# Patient Record
Sex: Female | Born: 1970 | Race: Black or African American | Hispanic: No | Marital: Single | Smoking: Never smoker
Health system: Southern US, Community
[De-identification: ages and names within clinical notes are randomized; demographics above are authoritative.]

## PROBLEM LIST (undated history)

## (undated) DIAGNOSIS — K5900 Constipation, unspecified: Secondary | ICD-10-CM

## (undated) DIAGNOSIS — I10 Essential (primary) hypertension: Secondary | ICD-10-CM

## (undated) DIAGNOSIS — F32A Depression, unspecified: Secondary | ICD-10-CM

## (undated) DIAGNOSIS — I340 Nonrheumatic mitral (valve) insufficiency: Secondary | ICD-10-CM

## (undated) DIAGNOSIS — I509 Heart failure, unspecified: Secondary | ICD-10-CM

## (undated) DIAGNOSIS — D649 Anemia, unspecified: Secondary | ICD-10-CM

## (undated) DIAGNOSIS — Z992 Dependence on renal dialysis: Secondary | ICD-10-CM

## (undated) DIAGNOSIS — Z8489 Family history of other specified conditions: Secondary | ICD-10-CM

## (undated) DIAGNOSIS — F329 Major depressive disorder, single episode, unspecified: Secondary | ICD-10-CM

## (undated) DIAGNOSIS — N2581 Secondary hyperparathyroidism of renal origin: Secondary | ICD-10-CM

## (undated) DIAGNOSIS — R011 Cardiac murmur, unspecified: Secondary | ICD-10-CM

## (undated) DIAGNOSIS — N189 Chronic kidney disease, unspecified: Secondary | ICD-10-CM

## (undated) HISTORY — DX: Nonrheumatic mitral (valve) insufficiency: I34.0

## (undated) HISTORY — DX: Essential (primary) hypertension: I10

## (undated) HISTORY — DX: Chronic kidney disease, unspecified: N18.9

## (undated) HISTORY — PX: CYSTECTOMY: SUR359

## (undated) HISTORY — DX: Heart failure, unspecified: I50.9

## (undated) HISTORY — DX: Secondary hyperparathyroidism of renal origin: N25.81

## (undated) SURGERY — Surgical Case
Anesthesia: *Unknown

---

## 2000-03-09 ENCOUNTER — Encounter: Admission: RE | Admit: 2000-03-09 | Discharge: 2000-03-09 | Payer: Self-pay | Admitting: Family Medicine

## 2000-10-29 ENCOUNTER — Encounter: Admission: RE | Admit: 2000-10-29 | Discharge: 2000-10-29 | Payer: Self-pay | Admitting: Family Medicine

## 2004-05-03 ENCOUNTER — Emergency Department (HOSPITAL_COMMUNITY): Admission: EM | Admit: 2004-05-03 | Discharge: 2004-05-03 | Payer: Self-pay | Admitting: Family Medicine

## 2006-10-10 ENCOUNTER — Emergency Department (HOSPITAL_COMMUNITY): Admission: EM | Admit: 2006-10-10 | Discharge: 2006-10-10 | Payer: Self-pay | Admitting: Emergency Medicine

## 2009-10-24 ENCOUNTER — Inpatient Hospital Stay (HOSPITAL_COMMUNITY): Admission: EM | Admit: 2009-10-24 | Discharge: 2009-10-28 | Payer: Self-pay | Admitting: Emergency Medicine

## 2009-10-24 ENCOUNTER — Ambulatory Visit: Payer: Self-pay | Admitting: Cardiology

## 2009-10-24 ENCOUNTER — Encounter (INDEPENDENT_AMBULATORY_CARE_PROVIDER_SITE_OTHER): Payer: Self-pay | Admitting: Internal Medicine

## 2009-11-04 ENCOUNTER — Ambulatory Visit: Payer: Self-pay | Admitting: Cardiology

## 2009-11-05 ENCOUNTER — Encounter: Admission: RE | Admit: 2009-11-05 | Discharge: 2009-11-05 | Payer: Self-pay | Admitting: Cardiology

## 2009-11-16 HISTORY — PX: AV FISTULA PLACEMENT, RADIOCEPHALIC: SHX1208

## 2009-12-03 ENCOUNTER — Ambulatory Visit: Payer: Self-pay | Admitting: Vascular Surgery

## 2009-12-05 ENCOUNTER — Ambulatory Visit: Payer: Self-pay | Admitting: Vascular Surgery

## 2009-12-05 ENCOUNTER — Ambulatory Visit (HOSPITAL_COMMUNITY): Admission: RE | Admit: 2009-12-05 | Discharge: 2009-12-05 | Payer: Self-pay | Admitting: Vascular Surgery

## 2010-01-14 ENCOUNTER — Ambulatory Visit: Payer: Self-pay | Admitting: Vascular Surgery

## 2010-03-20 ENCOUNTER — Other Ambulatory Visit (HOSPITAL_COMMUNITY): Payer: Self-pay | Admitting: Nephrology

## 2010-03-20 DIAGNOSIS — N186 End stage renal disease: Secondary | ICD-10-CM

## 2010-03-31 ENCOUNTER — Ambulatory Visit (HOSPITAL_COMMUNITY)
Admission: RE | Admit: 2010-03-31 | Discharge: 2010-03-31 | Disposition: A | Discharge status: Home / Self Care | Payer: BC Managed Care – PPO | Source: Ambulatory Visit | Attending: Nephrology | Admitting: Nephrology

## 2010-03-31 DIAGNOSIS — T82898A Other specified complication of vascular prosthetic devices, implants and grafts, initial encounter: Secondary | ICD-10-CM | POA: Insufficient documentation

## 2010-03-31 DIAGNOSIS — Y832 Surgical operation with anastomosis, bypass or graft as the cause of abnormal reaction of the patient, or of later complication, without mention of misadventure at the time of the procedure: Secondary | ICD-10-CM | POA: Insufficient documentation

## 2010-03-31 DIAGNOSIS — N186 End stage renal disease: Secondary | ICD-10-CM | POA: Insufficient documentation

## 2010-03-31 MED ORDER — IOHEXOL 300 MG/ML  SOLN: 100.0000 mL | Freq: Once | INTRAMUSCULAR | Status: AC | PRN

## 2010-04-30 LAB — POCT I-STAT 4, (NA,K, GLUC, HGB,HCT)
HCT: 38 % (ref 36.0–46.0)
Hemoglobin: 12.9 g/dL (ref 12.0–15.0)
Sodium: 133 mEq/L — ABNORMAL LOW (ref 135–145)

## 2010-04-30 LAB — SURGICAL PCR SCREEN
MRSA, PCR: NEGATIVE
Staphylococcus aureus: NEGATIVE

## 2010-05-01 LAB — BASIC METABOLIC PANEL
BUN: 46 mg/dL — ABNORMAL HIGH (ref 6–23)
BUN: 48 mg/dL — ABNORMAL HIGH (ref 6–23)
BUN: 50 mg/dL — ABNORMAL HIGH (ref 6–23)
BUN: 51 mg/dL — ABNORMAL HIGH (ref 6–23)
CO2: 28 mEq/L (ref 19–32)
CO2: 31 mEq/L (ref 19–32)
Calcium: 8.6 mg/dL (ref 8.4–10.5)
Calcium: 8.7 mg/dL (ref 8.4–10.5)
Chloride: 100 mEq/L (ref 96–112)
Chloride: 102 mEq/L (ref 96–112)
Chloride: 99 mEq/L (ref 96–112)
Creatinine, Ser: 3.67 mg/dL — ABNORMAL HIGH (ref 0.4–1.2)
GFR calc Af Amer: 17 mL/min — ABNORMAL LOW (ref >60)
GFR calc non Af Amer: 13 mL/min — ABNORMAL LOW (ref >60)
Glucose, Bld: 105 mg/dL — ABNORMAL HIGH (ref 70–99)
Glucose, Bld: 114 mg/dL — ABNORMAL HIGH (ref 70–99)
Glucose, Bld: 114 mg/dL — ABNORMAL HIGH (ref 70–99)
Potassium: 3.1 mEq/L — ABNORMAL LOW (ref 3.5–5.1)
Potassium: 3.5 mEq/L (ref 3.5–5.1)

## 2010-05-01 LAB — BLOOD GAS, ARTERIAL
Acid-Base Excess: 5.5 mmol/L — ABNORMAL HIGH (ref 0.0–2.0)
Drawn by: 308601
FIO2: 0.21 %
O2 Saturation: 95.3 %
Patient temperature: 98.6
TCO2: 26.4 mmol/L (ref 0–100)
pCO2 arterial: 38 mmHg (ref 35.0–45.0)

## 2010-05-01 LAB — DIFFERENTIAL
Basophils Absolute: 0.1 K/uL (ref 0.0–0.1)
Basophils Relative: 1 % (ref 0–1)
Basophils Relative: 1 % (ref 0–1)
Eosinophils Absolute: 0.1 10*3/uL (ref 0.0–0.7)
Eosinophils Absolute: 0.1 K/uL (ref 0.0–0.7)
Eosinophils Relative: 1 % (ref 0–5)
Eosinophils Relative: 1 % (ref 0–5)
Lymphocytes Relative: 12 % (ref 12–46)
Lymphocytes Relative: 20 % (ref 12–46)
Lymphs Abs: 1 K/uL (ref 0.7–4.0)
Monocytes Absolute: 0.3 K/uL (ref 0.1–1.0)
Monocytes Relative: 3 % (ref 3–12)
Neutro Abs: 4.4 10*3/uL (ref 1.7–7.7)
Neutro Abs: 6.9 K/uL (ref 1.7–7.7)
Neutrophils Relative %: 83 % — ABNORMAL HIGH (ref 43–77)

## 2010-05-01 LAB — CBC
HCT: 29.3 % — ABNORMAL LOW (ref 36.0–46.0)
Hemoglobin: 11.4 g/dL — ABNORMAL LOW (ref 12.0–15.0)
Hemoglobin: 9.3 g/dL — ABNORMAL LOW (ref 12.0–15.0)
Hemoglobin: 9.8 g/dL — ABNORMAL LOW (ref 12.0–15.0)
MCH: 22.9 pg — ABNORMAL LOW (ref 26.0–34.0)
MCH: 23.1 pg — ABNORMAL LOW (ref 26.0–34.0)
MCH: 23.2 pg — ABNORMAL LOW (ref 26.0–34.0)
MCHC: 31.8 g/dL (ref 30.0–36.0)
MCHC: 31.9 g/dL (ref 30.0–36.0)
MCV: 72.3 fL — ABNORMAL LOW (ref 78.0–100.0)
MCV: 72.5 fL — ABNORMAL LOW (ref 78.0–100.0)
MCV: 72.8 fL — ABNORMAL LOW (ref 78.0–100.0)
Platelets: 272 10*3/uL (ref 150–400)
Platelets: 321 10*3/uL (ref 150–400)
RBC: 4.23 MIL/uL (ref 3.87–5.11)
RBC: 4.99 MIL/uL (ref 3.87–5.11)
RDW: 22.5 % — ABNORMAL HIGH (ref 11.5–15.5)
WBC: 6.2 10*3/uL (ref 4.0–10.5)
WBC: 8.4 10*3/uL (ref 4.0–10.5)

## 2010-05-01 LAB — BASIC METABOLIC PANEL WITH GFR
BUN: 46 mg/dL — ABNORMAL HIGH (ref 6–23)
CO2: 26 meq/L (ref 19–32)
Calcium: 9 mg/dL (ref 8.4–10.5)
Chloride: 101 meq/L (ref 96–112)
Creatinine, Ser: 4 mg/dL — ABNORMAL HIGH (ref 0.4–1.2)
GFR calc non Af Amer: 12 mL/min — ABNORMAL LOW
Glucose, Bld: 201 mg/dL — ABNORMAL HIGH (ref 70–99)
Potassium: 3.3 meq/L — ABNORMAL LOW (ref 3.5–5.1)
Sodium: 139 meq/L (ref 135–145)

## 2010-05-01 LAB — VITAMIN B12: Vitamin B-12: >2000 pg/mL — ABNORMAL HIGH (ref 211–911)

## 2010-05-01 LAB — CK TOTAL AND CKMB (NOT AT ARMC)
CK, MB: 4.5 ng/mL — ABNORMAL HIGH (ref 0.3–4.0)
Relative Index: 3.6 — ABNORMAL HIGH (ref 0.0–2.5)
Total CK: 124 U/L (ref 7–177)

## 2010-05-01 LAB — PROTEIN ELECTROPHORESIS, SERUM
Albumin ELP: 51.6 % — ABNORMAL LOW (ref 55.8–66.1)
Total Protein ELP: 5.3 g/dL — ABNORMAL LOW (ref 6.0–8.3)

## 2010-05-01 LAB — LIPID PANEL
Cholesterol: 163 mg/dL (ref 0–200)
HDL: 58 mg/dL (ref >39)
LDL Cholesterol: 84 mg/dL (ref 0–99)
Total CHOL/HDL Ratio: 2.8 RATIO
Triglycerides: 105 mg/dL (ref <150)

## 2010-05-01 LAB — URINALYSIS, ROUTINE W REFLEX MICROSCOPIC
Ketones, ur: NEGATIVE mg/dL
Leukocytes, UA: NEGATIVE
Nitrite: NEGATIVE
Protein, ur: >300 mg/dL — AB
Urobilinogen, UA: 0.2 mg/dL (ref 0.0–1.0)

## 2010-05-01 LAB — BRAIN NATRIURETIC PEPTIDE: Pro B Natriuretic peptide (BNP): >3200 pg/mL — ABNORMAL HIGH (ref 0.0–100.0)

## 2010-05-01 LAB — URINE CULTURE
Colony Count: 10000
Culture  Setup Time: 201109081304

## 2010-05-01 LAB — POCT I-STAT, CHEM 8
BUN: 47 mg/dL — ABNORMAL HIGH (ref 6–23)
Calcium, Ion: 1.06 mmol/L — ABNORMAL LOW (ref 1.12–1.32)
Chloride: 103 meq/L (ref 96–112)
Creatinine, Ser: 3.7 mg/dL — ABNORMAL HIGH (ref 0.4–1.2)
Glucose, Bld: 202 mg/dL — ABNORMAL HIGH (ref 70–99)
HCT: 40 % (ref 36.0–46.0)
Hemoglobin: 13.6 g/dL (ref 12.0–15.0)
Potassium: 3.3 meq/L — ABNORMAL LOW (ref 3.5–5.1)
Sodium: 137 meq/L (ref 135–145)
TCO2: 26 mmol/L (ref 0–100)

## 2010-05-01 LAB — T4, FREE: Free T4: 1.26 ng/dL (ref 0.80–1.80)

## 2010-05-01 LAB — ANA: Anti Nuclear Antibody(ANA): NEGATIVE

## 2010-05-01 LAB — RETICULOCYTES
RBC.: 4.55 MIL/uL (ref 3.87–5.11)
Retic Ct Pct: 3 % (ref 0.4–3.1)

## 2010-05-01 LAB — RAPID URINE DRUG SCREEN, HOSP PERFORMED
Amphetamines: NOT DETECTED
Barbiturates: NOT DETECTED
Benzodiazepines: NOT DETECTED

## 2010-05-01 LAB — PROTEIN, URINE, 24 HOUR
Collection Interval-UPROT: 24 hours
Protein, 24H Urine: 922 mg/d — ABNORMAL HIGH (ref 50–100)
Urine Total Volume-UPROT: 2425 mL

## 2010-05-01 LAB — CREATININE, URINE, 24 HOUR
Collection Interval-UCRE24: 24 hours
Urine Total Volume-UCRE24: 2425 mL

## 2010-05-01 LAB — MAGNESIUM: Magnesium: 2.1 mg/dL (ref 1.5–2.5)

## 2010-05-01 LAB — CARDIAC PANEL(CRET KIN+CKTOT+MB+TROPI)
Total CK: 99 U/L (ref 7–177)
Troponin I: 0.14 ng/mL — ABNORMAL HIGH (ref 0.00–0.06)

## 2010-05-01 LAB — URINE MICROSCOPIC-ADD ON

## 2010-05-01 LAB — D-DIMER, QUANTITATIVE

## 2010-05-01 LAB — TSH: TSH: 0.752 u[IU]/mL (ref 0.350–4.500)

## 2010-05-01 LAB — FERRITIN: Ferritin: 30 ng/mL (ref 10–291)

## 2010-05-01 LAB — PHOSPHORUS: Phosphorus: 5.6 mg/dL — ABNORMAL HIGH (ref 2.3–4.6)

## 2010-05-01 LAB — MRSA PCR SCREENING: MRSA by PCR: NEGATIVE

## 2010-05-01 LAB — HEMOGLOBIN A1C: Hgb A1c MFr Bld: 6.1 % — ABNORMAL HIGH (ref <5.7)

## 2010-05-01 LAB — TROPONIN I: Troponin I: 0.15 ng/mL — ABNORMAL HIGH (ref 0.00–0.06)

## 2010-05-01 LAB — IRON AND TIBC: UIBC: 285 ug/dL

## 2010-05-07 ENCOUNTER — Ambulatory Visit (INDEPENDENT_AMBULATORY_CARE_PROVIDER_SITE_OTHER): Payer: BC Managed Care – PPO | Admitting: Vascular Surgery

## 2010-05-07 DIAGNOSIS — I770 Arteriovenous fistula, acquired: Secondary | ICD-10-CM

## 2010-05-08 NOTE — Assessment & Plan Note (Signed)
OFFICE VISIT  Nicole Parrish, Nicole Parrish DOB:  04/09/1970                                       05/07/2010 [id number]  I saw this patient in the office today to evaluate her poorly functioning left forearm AV fistula.  This is a pleasant 40 year old woman with chronic kidney disease secondary to hypertension who had a left radiocephalic fistula placed in October of 2011.  This has not been functioning adequately and she was sent to have this evaluated.  Of note, she has had no recent uremic symptoms.  Specifically she denies nausea, vomiting, anorexia, fatigue, or palpitations.  She does have a functioning catheter and they have been using 1 needle in her fistula and using the catheter also.  REVIEW OF SYSTEMS:  CARDIOVASCULAR:  She had no chest pain, chest pressure, palpitations or arrhythmias.  PHYSICAL EXAMINATION:  this is a pleasant 40 year old woman who appears his stated age.  Blood pressure 137/99, heart rate is 75, respiratory rate is 20.  Lungs:  Clear bilaterally to auscultation.  Her fistula has a good thrill in the proximal aspect of the left forearm.  It is difficult to follow  beyond the mid forearm.  The fistula does not feel especially pulsatile to suggest an outflow stenosis.  She does have a good radial pulse do there does not appear to be a significant inflow problem.  I have reviewed her previous vein mapping prior to placement of the fistula which showed that she had a reasonable upper arm and forearm cephalic vein on the left and also reasonable basilic vein on the left.  I have recommended we proceed with fistulogram to look for competing branches or areas of intimal hyperplasia and also evaluate  the inflow. This has been scheduled for a nondialysis day on 05/12/2010.  I will make further recommendations pending these results.    Judeth Cornfield. Scot Dock, M.D. Electronically Signed  CSD/MEDQ  D:  05/07/2010  T:  05/08/2010  Job:   [id number]  cc:   Dr. Jimmy Footman

## 2010-05-12 ENCOUNTER — Ambulatory Visit (HOSPITAL_COMMUNITY)
Admission: RE | Admit: 2010-05-12 | Discharge: 2010-05-12 | Disposition: A | Discharge status: Home / Self Care | Payer: BC Managed Care – PPO | Source: Ambulatory Visit | Attending: Vascular Surgery | Admitting: Vascular Surgery

## 2010-05-12 DIAGNOSIS — Y832 Surgical operation with anastomosis, bypass or graft as the cause of abnormal reaction of the patient, or of later complication, without mention of misadventure at the time of the procedure: Secondary | ICD-10-CM | POA: Insufficient documentation

## 2010-05-12 DIAGNOSIS — T82898A Other specified complication of vascular prosthetic devices, implants and grafts, initial encounter: Secondary | ICD-10-CM

## 2010-05-12 DIAGNOSIS — N189 Chronic kidney disease, unspecified: Secondary | ICD-10-CM | POA: Insufficient documentation

## 2010-05-12 DIAGNOSIS — I120 Hypertensive chronic kidney disease with stage 5 chronic kidney disease or end stage renal disease: Secondary | ICD-10-CM

## 2010-05-12 DIAGNOSIS — Z01810 Encounter for preprocedural cardiovascular examination: Secondary | ICD-10-CM | POA: Insufficient documentation

## 2010-05-12 DIAGNOSIS — N186 End stage renal disease: Secondary | ICD-10-CM

## 2010-05-12 LAB — POCT I-STAT, CHEM 8
BUN: 37 mg/dL — ABNORMAL HIGH (ref 6–23)
Creatinine, Ser: 8.2 mg/dL — ABNORMAL HIGH (ref 0.4–1.2)
Glucose, Bld: 93 mg/dL (ref 70–99)
Hemoglobin: 12.2 g/dL (ref 12.0–15.0)
Potassium: 5.1 mEq/L (ref 3.5–5.1)
Sodium: 133 mEq/L — ABNORMAL LOW (ref 135–145)
TCO2: 49 mmol/L (ref 0–100)

## 2010-05-20 NOTE — Op Note (Signed)
  [name], [name]               ACCOUNT NO.:  [id number]  MEDICAL RECORD NO.:  [id number]           PATIENT TYPE:  O  LOCATION:  SDSC                         FACILITY:  [facility]  PHYSICIAN:  Judeth Cornfield. Scot Dock, M.D.DATE OF BIRTH:  Apr 30, 1970  DATE OF PROCEDURE:  05/12/2010 DATE OF DISCHARGE:  05/12/2010                              OPERATIVE REPORT   INDICATIONS:  This is a 40 year old woman who had a forearm fistula placed in October 2011.  This has been slow to mature and she is currently being dialyzed via a Diatek catheter.  She comes in for an elective fistulogram to evaluate her fistula.  PROCEDURE: 1. Ultrasound-guided access to left forearm AV fistula. 2. Fistulogram of left forearm AV fistula.  SURGEON:  Judeth Cornfield. Scot Dock, MD  ANESTHESIA:  Local.  TECHNIQUE:  The patient was taken to the PV lab and the left forearm was prepped and draped in the usual sterile fashion.  Under ultrasound guidance and after the skin was anesthetized, the proximal AV fistula was cannulated with a micropuncture needle.  Guidewire was introduced and the micropuncture sheath introduced.  Fistulogram was obtained using hand injections and the fistula was evaluated from the proximal anastomosis to the central veins.  The proximal fistula has an area of 2- 2.5 cm of narrowing likely related to intimal hyperplasia.  The radial artery is patent, although somewhat small.  There is one competing branch approximately 4 cm from the proximal anastomosis.  Beyond this, the vein is widely patent with no significant competing branches.  At the antecubital level, the cephalic vein continues up the upper arm and is widely patent with no significant competing branches.  The antecubital vein at the antecubital level feeds into the basilic vein which was also patent.  The subclavian and brachiocephalic veins are widely patent on the left.  CONCLUSIONS: 1. 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 2. One competing branch approximately 4 cm in the arterial     anastomosis.     Judeth Cornfield. Scot Dock, M.D.    CSD/MEDQ  D:  05/12/2010  T:  05/12/2010  Job:  [id number]  cc:   Jeneen Rinks L. Deterding, M.D.  Electronically Signed by Deitra Mayo M.D. on 05/20/2010 07:42:23 AM

## 2010-05-21 ENCOUNTER — Ambulatory Visit (HOSPITAL_COMMUNITY)
Admission: RE | Admit: 2010-05-21 | Discharge: 2010-05-21 | Disposition: A | Discharge status: Home / Self Care | Payer: BC Managed Care – PPO | Source: Ambulatory Visit | Attending: Vascular Surgery | Admitting: Vascular Surgery

## 2010-06-02 ENCOUNTER — Ambulatory Visit (HOSPITAL_COMMUNITY)
Admission: RE | Admit: 2010-06-02 | Discharge: 2010-06-02 | Disposition: A | Discharge status: Home / Self Care | Payer: BC Managed Care – PPO | Source: Ambulatory Visit | Attending: Vascular Surgery | Admitting: Vascular Surgery

## 2010-06-02 DIAGNOSIS — N186 End stage renal disease: Secondary | ICD-10-CM

## 2010-06-02 DIAGNOSIS — I120 Hypertensive chronic kidney disease with stage 5 chronic kidney disease or end stage renal disease: Secondary | ICD-10-CM | POA: Insufficient documentation

## 2010-06-02 DIAGNOSIS — Z79899 Other long term (current) drug therapy: Secondary | ICD-10-CM | POA: Insufficient documentation

## 2010-06-02 DIAGNOSIS — Y832 Surgical operation with anastomosis, bypass or graft as the cause of abnormal reaction of the patient, or of later complication, without mention of misadventure at the time of the procedure: Secondary | ICD-10-CM | POA: Insufficient documentation

## 2010-06-02 DIAGNOSIS — Z992 Dependence on renal dialysis: Secondary | ICD-10-CM | POA: Insufficient documentation

## 2010-06-02 DIAGNOSIS — Z7982 Long term (current) use of aspirin: Secondary | ICD-10-CM | POA: Insufficient documentation

## 2010-06-02 DIAGNOSIS — T82898A Other specified complication of vascular prosthetic devices, implants and grafts, initial encounter: Secondary | ICD-10-CM

## 2010-06-02 DIAGNOSIS — T82598A Other mechanical complication of other cardiac and vascular devices and implants, initial encounter: Secondary | ICD-10-CM | POA: Insufficient documentation

## 2010-06-02 LAB — SURGICAL PCR SCREEN
MRSA, PCR: NEGATIVE
Staphylococcus aureus: NEGATIVE

## 2010-06-03 HISTORY — PX: AV FISTULA REPAIR: SHX563

## 2010-06-03 LAB — POCT I-STAT 4, (NA,K, GLUC, HGB,HCT)
Glucose, Bld: 96 mg/dL (ref 70–99)
HCT: 31 % — ABNORMAL LOW (ref 36.0–46.0)
Potassium: 4.6 mEq/L (ref 3.5–5.1)

## 2010-06-03 NOTE — Op Note (Signed)
NAMEMONTERIA, HASSELMAN NO.:  [id number]  MEDICAL RECORD NO.:  [id number]           PATIENT TYPE:  LOCATION:                                 FACILITY:  PHYSICIAN:  Conrad [place], MD       DATE OF BIRTH:  March 21, 1970  DATE OF PROCEDURE: DATE OF DISCHARGE:                              OPERATIVE REPORT   PROCEDURE:  Ligation of competing branches of a left radiocephalic arteriovenous fistula.  PREOPERATIVE DIAGNOSIS:  Nonmaturing left radiocephalic arteriovenous fistula.  POSTOPERATIVE DIAGNOSIS:  Nonmaturing left radiocephalic arteriovenous fistula.  SURGEON:  Conrad Avery, MD.  ANESTHESIA:  General.  FINDINGS:  In this case included 1. Three side branches at the level of the antecubital off the     cephalic vein. 2. Improved thrill at the end of case.  SPECIMENS:  None.  INDICATIONS:  This is a 40 year old female that previously undergone a left radiocephalic arteriovenous fistula.  It was failing to mature and attempts to cannulate had failed.  A fistulogram was completed, which demonstrated significant antecubital side branches siphoning flow off of it, it is unclear if there is also a proximal cephalic vein stenosis versus an underfilling phenomenon due to the siphoned flow.  Prior to proceeding with possible cephalic turndown procedure versus interposition graft of the proximal cephalic vein, I felt that first attempt at improving flow through this cephalic vein via a ligation of competing branches would be the initial step and then reinterrogation of this fistula after that.  The patient is aware of the risks of this procedure include bleeding, infection, possible thrombosis of this fistula, and possible need for additional procedures.  The patient was aware of these risks and agreed to proceed forward with such.  DESCRIPTION OF THE OPERATION:  After full informed written consent was obtained from the patient, she was brought back to  the operating room, placed supine upon on the operating table.  Prior to induction, she received IV antibiotics.  She was then prepped and draped in standard fashion for left arm access procedure.  I turned my attention to her left antecubitum.  I previously identified the cephalic vein with large branches at the antecubitum under nonsterile SonoSite interrogation and marked out on the arm previously where this vein was located.  After the arm had been prepped those marks remained in place, I injected a total about 6 mL of 0.5% Marcaine without epinephrine at this location, I made a incision longitudinally over the cephalic vein and dissected down to the cephalic vein. Immediately it became evident there was one large branch extending toward the basilic system and exactly as depicted on the venogram.  I ligated this with 2 ties of 2-0 silk and then transected this side branch and I then dissected out further the cephalic vein and identified 2 additional side branches. These were ligated also with silk ties and then transected.  At thispoint, after interrogating rest of the cephalic vein at the antecubital level, there was no more side branches present.  I interrogated the fistula then with continuous Doppler, there remained a good arteriovenous fistula signal that  demonstrated no significant arterial component of it.  I also interrogated the rest, there remained a dopplerable radial signal.  At this point, the arm was irrigated out and reapproximated the subcu tissue with interrupted stitch of 3-0 Vicryl. The skin was then reapproximated with running subcuticular 4-0 Monocryl. Skin was then cleaned, dried and then Dermabond was used to reinforce the skin closure.  But at the end of the case, the thrill in the cephalic vein was improved from previous, it was actually palpable at this point at the level of the antecubital and slightly onto the upper arm where I suspect the cephalic vein runs  deep.  The patient was allowed to awaken without any difficulties.  The plan is to discharge her home.  COMPLICATIONS:  None.  CONDITION:  Stable.     Conrad Myrtlewood, MD     BLC/MEDQ  D:  06/02/2010  T:  06/02/2010  Job:  [id number]  Electronically Signed by Adele Barthel MD on 06/03/2010 10:27:05 AM

## 2010-06-20 ENCOUNTER — Ambulatory Visit (INDEPENDENT_AMBULATORY_CARE_PROVIDER_SITE_OTHER): Payer: BC Managed Care – PPO | Admitting: Vascular Surgery

## 2010-06-20 ENCOUNTER — Telehealth: Payer: Self-pay | Admitting: Cardiology

## 2010-06-20 DIAGNOSIS — N186 End stage renal disease: Secondary | ICD-10-CM

## 2010-06-20 NOTE — Telephone Encounter (Signed)
PT THINKS SHE MAY HAVE TO START TAKING A MED FOR RAPID HR. CHART PLACED IN BOX.

## 2010-06-20 NOTE — Telephone Encounter (Signed)
Returning pts call.  L/M on the home # and cell#.

## 2010-06-23 ENCOUNTER — Telehealth: Payer: Self-pay | Admitting: *Deleted

## 2010-06-23 NOTE — Assessment & Plan Note (Signed)
OFFICE VISIT  NESTORA, INNAMORATO DOB:  [birth year]                                       06/20/2010 [id number]  This is postop followup.  HISTORY OF PRESENT ILLNESS:  This is a 40 year old patient who underwent a ligation of side branches off of her left radiocephalic arteriovenous fistula.  At this point she notes some intermittent numbness in her hand but is able to complete her activities of daily living.  No steal symptomatology is noted by this patient.  PHYSICAL EXAMINATION:  Blood pressure today was 133/91, heart rate 79, respirations 12.  On focused exam her left arm the incisions at the antecubitum are well-healed.  There is a strongly palpable thrill in this radiocephalic arteriovenous fistula at this point.  I interrogated the entirety of the cephalic vein with a SonoSite and it appears to be greater than 6 mm throughout this entire vein.  Also in the forearm appears to be less than 6 mm in depth.  MEDICAL DECISION MAKING:  This is a 40 year old female who now with additional side branch ligation at the antecubitum appears to have had successfully matured left radiocephalic arteriovenous fistula.  At this point I would go ahead and give permission to proceed forward with cannulation.  After two successful cannulations I would at that point remove her tunneled dialysis catheter.    Conrad Cordova, MD Electronically Signed  BLC/MEDQ  D:  06/20/2010  T:  06/23/2010  Job:  [id number]

## 2010-07-01 NOTE — Assessment & Plan Note (Signed)
OFFICE VISIT   LEALAH, URBANIAK  DOB:  1970/03/13                                       01/14/2010  [id number]   The patient returns today for initial followup regarding her left Cimino  fistula which I created on 12/05/2009 for end-stage renal disease.  She  is currently using a Diatek dialysis catheter in the right internal  jugular vein.  She has no pain or numbness in the left hand.  She does  have a pulse and palpable thrill overlying the fistula at the  antecubital area.  The vein is slightly deep but easily palpable and may  well be satisfactory at its current location.  The fistula will be ready  to try in late January and hopefully it will provide a satisfactory site  for vascular access for this nice lady.     Nelda Severe Kellie Simmering, M.D.  Electronically Signed   JDL/MEDQ  D:  01/14/2010  T:  01/14/2010  Job:  [id number]   cc:   Louis Meckel, M.D.

## 2010-07-01 NOTE — Consult Note (Signed)
NEW PATIENT CONSULTATION   Nicole Parrish, Nicole Parrish  DOB:  [birth year]                                       12/03/2009  [id number]   The patient is a 40 year old African American female with recent  diagnosis of end-stage renal disease not on hemodialysis.  She is right-  handed.  She was referred for vascular access.  She had a recent  hospitalization when her diagnosis was made 1 month ago and at that time  she had congestive heart failure which has resolved.   CHRONIC MEDICAL PROBLEMS:  1. Hypertension.  2. Congestive heart failure.  3. End-stage renal disease.  4. Negative for coronary artery disease, diabetes, hyperlipidemia,      COPD, or stroke.   SOCIAL HISTORY:  She is single; has no children.  She works as a Education officer, environmental.  Does not use tobacco or alcohol.   FAMILY HISTORY:  Positive for an uncle who is on hemodialysis.  Diabetes  is positive in the family as well as hypertension.  Father had lung  cancer.   REVIEW OF SYSTEMS:  Positive for weight loss, dizziness, orthopnea which  has now resolved, anemia, melanotic stools, constipation, and chronic  kidney disease.  All other systems are negative per review of systems.   PHYSICAL EXAM:  Blood pressure 140/80, heart rate 70, respirations 14.  GENERAL:  She is a well-developed, well-nourished female who is in no  apparent distress.  She is alert and oriented x3.  HEENT:  Exam is normal for age.  EOMs intact.  LUNGS:  Clear to auscultation.  No rhonchi or wheezing.  CARDIOVASCULAR:  Regular rhythm.  No murmurs.  Carotid pulses 3+, no  bruits.  ABDOMEN:  Soft, nontender with no masses.  MUSCULOSKELETAL:  Free of major deformities.  NEUROLOGIC:  Normal.  SKIN:  Free of rashes.  LOWER EXTREMITY EXAM:  No evidence of ischemia or significant edema.   Today, I ordered bilateral upper extremity vein mapping.  Chart reviewed  and interpreted.  Her cephalic veins are patent and compressible  bilaterally, and it appears the left forearm cephalic is probably  adequate and, if not, the upper arm cephalic should be adequate.  She  also has adequate basilic veins bilaterally.   We will plan to create a left forearm or upper arm AV fistula 2 days  from now on October 20th at [facility] as an outpatient.  Hopefully,  this will provide a satisfactory site for vascular access for this nice  lady.     Nelda Severe Kellie Simmering, M.D.  Electronically Signed   JDL/MEDQ  D:  12/03/2009  T:  12/04/2009  Job:  [id number]   cc:   Louis Meckel, M.D.

## 2010-07-01 NOTE — Procedures (Signed)
CEPHALIC VEIN MAPPING   INDICATION:  AV fistula placement.   HISTORY:  Chronic kidney disease.   EXAM:  The right cephalic vein is compressible.   Diameter measurements range from 0.26 to 0.75 cm.   The basilic vein is compressible.   Diameter measurements range from 0.50 to 0.57 cm.   The left cephalic vein is compressible.   Diameter measurements range from 0.38 to 0.88 cm.   The left basilic vein is compressible.   Diameter measurements range from 0.48 to 0.52 cm.   See attached worksheet for all measurements along with branches marked.   IMPRESSION:  Patent bilateral cephalic vein which is of acceptable  diameter to use for a dialysis access site.   ___________________________________________  Nelda Severe. Kellie Simmering, M.D.   OD/MEDQ  D:  12/03/2009  T:  12/03/2009  Job:  [id number]

## 2010-07-02 NOTE — Telephone Encounter (Signed)
Pt never called back.

## 2010-08-06 ENCOUNTER — Telehealth: Payer: Self-pay | Admitting: Cardiology

## 2010-08-06 NOTE — Telephone Encounter (Signed)
RN set pt up to f/u with Dr. Johnsie Cancel on 09/03/10 at 1030.  Pt notified of appointment.

## 2010-08-06 NOTE — Telephone Encounter (Signed)
Called in today wondering about who the next cardiologist that Dr. Doreatha Lew referred her to is. Please call back. I have pulled her chart.

## 2010-08-08 ENCOUNTER — Other Ambulatory Visit (HOSPITAL_COMMUNITY): Payer: Self-pay | Admitting: Nephrology

## 2010-08-08 DIAGNOSIS — N186 End stage renal disease: Secondary | ICD-10-CM

## 2010-08-13 ENCOUNTER — Ambulatory Visit (HOSPITAL_COMMUNITY)
Admission: RE | Admit: 2010-08-13 | Discharge: 2010-08-13 | Disposition: A | Discharge status: Home / Self Care | Payer: BC Managed Care – PPO | Source: Ambulatory Visit | Attending: Nephrology | Admitting: Nephrology

## 2010-08-13 ENCOUNTER — Other Ambulatory Visit (HOSPITAL_COMMUNITY): Payer: Self-pay | Admitting: Nephrology

## 2010-08-13 DIAGNOSIS — N186 End stage renal disease: Secondary | ICD-10-CM

## 2010-08-13 DIAGNOSIS — T82898A Other specified complication of vascular prosthetic devices, implants and grafts, initial encounter: Secondary | ICD-10-CM | POA: Insufficient documentation

## 2010-08-13 DIAGNOSIS — Y832 Surgical operation with anastomosis, bypass or graft as the cause of abnormal reaction of the patient, or of later complication, without mention of misadventure at the time of the procedure: Secondary | ICD-10-CM | POA: Insufficient documentation

## 2010-08-13 MED ORDER — IOHEXOL 300 MG/ML  SOLN
100.0000 mL | Freq: Once | INTRAMUSCULAR | Status: AC | PRN
  Administered 2010-08-13: 32 mL via INTRAVENOUS

## 2010-08-22 ENCOUNTER — Encounter: Payer: Self-pay | Admitting: Vascular Surgery

## 2010-08-27 NOTE — Progress Notes (Signed)
[facility] [place]  Established Dialysis Access  History of Present Illness  Nicole Parrish is a 40 y.o. female who presents for re-evaluation for permanent access.  The patient is right hand dominant.  Previous access procedures have been completed in the right arm.  The patient's complication from previous access procedures include: poorly functioning L RC AVF.  The patient has had multiple episodes of infilitration of the L RC AVF.  Most recent procedure was Ligation of competing branches of a left radiocephalic AVF (Date: 123XX123). And recently had a fistulogram done on 08/13/10 which demonstrated that the fistula was widely patent.  Past Medical History, Past Surgical History, Social History, Family History, Medications, Allergies, and Review of Systems are unchanged from previous visit.  Physical Examination  Filed Vitals:   08/29/10 0917  BP: 100/67  Pulse: 80  Resp: 18    General: A&O x 3, WDWN  Pulmonary: Sym exp, good air movt, CTAB, no rales, rhonchi, & wheezing  Cardiac: RRR, Nl S1, S2, no Murmurs, rubs or gallops  Gastrointestinal: soft, NTND, -G/R, - HSM, - masses, - CVAT B  Musculoskeletal: M/S 5/5 throughout, Extremities without  ischemic changes, palpable thrill in right forearm, incisions well healed  Neurologic: CN 2-12 intact, Pain and light touch intact in extremities, Motor exam as listed above  Medical Decision Making  Nicole Parrish is a 40 y.o. female who presents with ESRD requiring hemodialysis with a L RC AVF that is difficult to access.   In this patient, I would duplex the right right RC AVF to evaluate depth and diameter of the conduit to see if a superficialization procedure is necessary.  This study will be obtained in the next 2 weeks and follow up in the office after such.  Adele Barthel, MD [facility] [place] Office: [phone] Pager: [phone]

## 2010-08-29 ENCOUNTER — Encounter: Payer: Self-pay | Admitting: Vascular Surgery

## 2010-08-29 ENCOUNTER — Ambulatory Visit (INDEPENDENT_AMBULATORY_CARE_PROVIDER_SITE_OTHER): Payer: BC Managed Care – PPO | Admitting: Vascular Surgery

## 2010-08-29 VITALS — BP 100/67 | HR 80 | Resp 18 | Ht 60.0 in | Wt 175.0 lb

## 2010-08-29 DIAGNOSIS — Z992 Dependence on renal dialysis: Secondary | ICD-10-CM | POA: Insufficient documentation

## 2010-08-29 DIAGNOSIS — N186 End stage renal disease: Secondary | ICD-10-CM | POA: Insufficient documentation

## 2010-08-29 NOTE — Progress Notes (Signed)
ESRD  CK AVF  ? POORLY FUNCTIONING  LIGATION ON 06/02/10

## 2010-09-03 ENCOUNTER — Ambulatory Visit (INDEPENDENT_AMBULATORY_CARE_PROVIDER_SITE_OTHER): Payer: BC Managed Care – PPO | Admitting: Cardiovascular Disease

## 2010-09-03 ENCOUNTER — Encounter: Payer: Self-pay | Admitting: Cardiovascular Disease

## 2010-09-03 VITALS — BP 114/73 | HR 86 | Ht 60.0 in | Wt 169.0 lb

## 2010-09-03 DIAGNOSIS — I059 Rheumatic mitral valve disease, unspecified: Secondary | ICD-10-CM

## 2010-09-03 DIAGNOSIS — I2789 Other specified pulmonary heart diseases: Secondary | ICD-10-CM

## 2010-09-03 DIAGNOSIS — I340 Nonrheumatic mitral (valve) insufficiency: Secondary | ICD-10-CM

## 2010-09-03 DIAGNOSIS — D649 Anemia, unspecified: Secondary | ICD-10-CM | POA: Insufficient documentation

## 2010-09-03 DIAGNOSIS — N186 End stage renal disease: Secondary | ICD-10-CM

## 2010-09-03 DIAGNOSIS — N189 Chronic kidney disease, unspecified: Secondary | ICD-10-CM | POA: Insufficient documentation

## 2010-09-03 DIAGNOSIS — I272 Pulmonary hypertension, unspecified: Secondary | ICD-10-CM | POA: Insufficient documentation

## 2010-09-03 NOTE — Progress Notes (Signed)
40 yo patient of Dr Doreatha Lew and [place].  Added on to DOD schedule for tachycardia, history of MR and pulmonary hypertension.  Last seen by Dr Martinique 10/25/09.  Had moderate MR with pulmonary htn estimated PA pressure 72 mmHG by TR.  Hydralazine dose adjusted.  Since then has been put on dialysis.  Having difficulty with fistula and still using ditek catheter.  Has episodes of palpitations.  Imprvoved with Torprol.  Dose just increased a month ago.  Indicates significant anemia requiring transfusions  Denies SSCP, mild chronic exertional dyspnea.  Some paresthesias in left arm from fistula.    Over 50% time spent reviewing old records from [place] cardiology:  Time 30 minutes.  Remainder spent face to face with patient Echo 10/24/09 Moderate MR EF 50-55% PA 72 mmHg Reviewed labs 06/02/10  Hb 10.5  ROS: Denies fever, malais, weight loss, blurry vision, decreased visual acuity, cough, sputum, SOB, hemoptysis, pleuritic pain, palpitaitons, heartburn, abdominal pain, melena, lower extremity edema, claudication, or rash.  All other systems reviewed and negative   General: Affect appropriate Healthy:  appears stated age 40: normal Neck supple with no adenopathy JVP normal no bruits no thyromegaly Lungs clear with no wheezing and good diaphragmatic motion Heart:  S1/S2 SEM murmur,rub, gallop or click PMI normal Abdomen: benighn, BS positve, no tenderness, no AAA no bruit.  No HSM or HJR Distal pulses intact with no bruits No edema Neuro non-focal Skin warm and dry No muscular weakness Fistula left arm good thrill ditek catheter right subclavian  Medications Current Outpatient Prescriptions  Medication Sig Dispense Refill  . aspirin 81 MG tablet Take 81 mg by mouth daily.        Marland Kitchen b complex-vitamin c-folic acid (NEPHRO-VITE) 0.8 MG TABS Take 0.8 mg by mouth at bedtime.        . calcium acetate (PHOSLO) 667 MG capsule Take 667 mg by mouth 2 (two) times daily with a meal.        .  isosorbide mononitrate (IMDUR) 30 MG 24 hr tablet Take 30 mg by mouth daily.        Marland Kitchen lidocaine-prilocaine (EMLA) cream Apply topically as needed.        . metoprolol (TOPROL-XL) 50 MG 24 hr tablet Take 50 mg by mouth daily.          Allergies Review of patient's allergies indicates no known allergies.  Family History: No family history on file.  Social History: History   Social History  . Marital Status: Single    Spouse Name: N/A    Number of Children: N/A  . Years of Education: N/A   Occupational History  . Not on file.   Social History Main Topics  . Smoking status: Never Smoker   . Smokeless tobacco: Never Used  . Alcohol Use: No  . Drug Use: Not on file  . Sexually Active: Not on file   Other Topics Concern  . Not on file   Social History Narrative  . No narrative on file    Electrocardiogram:  05/14/10  NSR 78 normal ECG  Assessment and Plan

## 2010-09-03 NOTE — Assessment & Plan Note (Signed)
Ditek still in will see if it is in RA.  Increase risk of infection  Hopefully fistula will be usable soon.  ? Start [place] F/U labs Dr Dederding

## 2010-09-03 NOTE — Patient Instructions (Signed)
Your physician recommends that you schedule a follow-up appointment in: 3 MONTHS WITH DR Martinique  Your physician has requested that you have an echocardiogram. Echocardiography is a painless test that uses sound waves to create images of your heart. It provides your doctor with information about the size and shape of your heart and how well your heart's chambers and valves are working. This procedure takes approximately one hour. There are no restrictions for this procedure. PT'S CONVENIENCE DX MR

## 2010-09-03 NOTE — Assessment & Plan Note (Signed)
F/U echo  If significant may need right and left heart cath.

## 2010-09-03 NOTE — Assessment & Plan Note (Addendum)
Not clealy defined or Rx in past.  Echo  Only on nitrates now not hydralazine.  Consider right heart cath if PA pressures over 40mmHg especially if MR worse

## 2010-09-03 NOTE — Assessment & Plan Note (Signed)
F/U renal  Last Hb 10.5  Start erythropoitin

## 2010-09-04 ENCOUNTER — Encounter: Payer: Self-pay | Admitting: *Deleted

## 2010-09-09 NOTE — Progress Notes (Signed)
[facility] [place]  Established Dialysis Access  History of Present Illness   Nicole Parrish is a 40 y.o. female who presents for re-evaluation for permanent access. The patient returns today for left radiocephalic arteriovenous fistula duplex ultrasound to evaluate the depth and velocities within this access. Since her last visit the patient has had no new complaints.  Previous attempts at cannulation of the fistula have been unsuccessful, resulting in infiltration.  Past Medical History, Past Surgical History, Social History, Family History, Medications, Allergies, and Review of Systems are unchanged from previous visit.   Physical Examination   Filed Vitals:   09/12/10 1105  BP: 106/69  Pulse: 73  Temp: 98.2 F (36.8 C)    General: A&O x 3, WDWN   Pulmonary: Sym exp, good air movt, CTAB, no rales, rhonchi, & wheezing   Cardiac: RRR, Nl S1, S2, no Murmurs, rubs or gallops   Gastrointestinal: soft, NTND, -G/R, - HSM, - masses, - CVAT B   Musculoskeletal: M/S 5/5 throughout, Extremities without ischemic changes, palpable thrill in the left forearm, incisions well healed   Neurologic: CN 2-12 intact, Pain and light touch intact in extremities, Motor exam as listed above   Non-Invasive Vascular Imaging  R RC AVF Duplex (Date: 99991111) Forearm cephalic: 6.4 mm - 7.0 mm except at arterial end 2.2 - 3.3 mm, depth 4.8 mm to 11 mm  Medical Decision Making   Nicole Parrish is a 40 y.o. female who presents with ESRD requiring hemodialysis with a L RC AVF that is difficult to access.   Based on this patient's study and clinical examination I have offered the patient the following: superficalization of the L RC AVF   The patient is aware of the risks and benefits of the procedure include but are not limited to: bleeding, infection, and possible thrombosis of the L RC AVF.  The patient is scheduled for 6 AUG 12.  Adele Barthel, MD  [facility] [place]  Office: [phone]  Pager: [phone]

## 2010-09-10 ENCOUNTER — Telehealth: Payer: Self-pay | Admitting: Cardiovascular Disease

## 2010-09-10 ENCOUNTER — Ambulatory Visit (HOSPITAL_COMMUNITY): Payer: BC Managed Care – PPO | Attending: Cardiology | Admitting: Radiology

## 2010-09-10 DIAGNOSIS — I059 Rheumatic mitral valve disease, unspecified: Secondary | ICD-10-CM

## 2010-09-10 DIAGNOSIS — I270 Primary pulmonary hypertension: Secondary | ICD-10-CM | POA: Insufficient documentation

## 2010-09-10 DIAGNOSIS — I340 Nonrheumatic mitral (valve) insufficiency: Secondary | ICD-10-CM

## 2010-09-10 DIAGNOSIS — I379 Nonrheumatic pulmonary valve disorder, unspecified: Secondary | ICD-10-CM | POA: Insufficient documentation

## 2010-09-10 NOTE — Telephone Encounter (Signed)
Left message for pt, results not available yet. Will call as soon as available Nicole Parrish

## 2010-09-10 NOTE — Telephone Encounter (Signed)
Pt had echo this am, wants to know if results are ready

## 2010-09-12 ENCOUNTER — Encounter (INDEPENDENT_AMBULATORY_CARE_PROVIDER_SITE_OTHER): Payer: BC Managed Care – PPO

## 2010-09-12 ENCOUNTER — Telehealth: Payer: Self-pay | Admitting: Cardiovascular Disease

## 2010-09-12 ENCOUNTER — Encounter: Payer: Self-pay | Admitting: Vascular Surgery

## 2010-09-12 ENCOUNTER — Ambulatory Visit (INDEPENDENT_AMBULATORY_CARE_PROVIDER_SITE_OTHER): Payer: BC Managed Care – PPO | Admitting: Vascular Surgery

## 2010-09-12 VITALS — BP 106/69 | HR 73 | Temp 98.2°F | Ht 60.0 in | Wt 170.0 lb

## 2010-09-12 DIAGNOSIS — N186 End stage renal disease: Secondary | ICD-10-CM | POA: Insufficient documentation

## 2010-09-12 DIAGNOSIS — T82598A Other mechanical complication of other cardiac and vascular devices and implants, initial encounter: Secondary | ICD-10-CM

## 2010-09-12 NOTE — Telephone Encounter (Signed)
Pt aware of echo results. She verbalized understanding.

## 2010-09-12 NOTE — Telephone Encounter (Signed)
Returning call back to nurse.  

## 2010-09-12 NOTE — Telephone Encounter (Signed)
Test results

## 2010-09-12 NOTE — Progress Notes (Signed)
F/u after AVF duplex of left arm today.   Has HD on Tuesday, Thursday and Saturdays via Right IJ cath.

## 2010-09-12 NOTE — Telephone Encounter (Signed)
Patient showed up requesting a copy of her Echo report  Medical records came and handled the request

## 2010-09-12 NOTE — Telephone Encounter (Signed)
lmom for patient to call back 

## 2010-09-12 NOTE — Telephone Encounter (Signed)
Mrs. Deakins came in and exchanged a copy of her Echo for a ROI.

## 2010-09-19 NOTE — Procedures (Unsigned)
VASCULAR LAB EXAM  INDICATION:  Followup nonmaturing left radiocephalic fistula.  HISTORY: CKD. Diabetes:  No. Cardiac:  No. Hypertension:  No.  EXAM:  The left radiocephalic fistula was examined with color Doppler and duplex.  The fistula is patent.  Depths and diameters were obtained and are detailed on attached diagram.  IMPRESSION:  Patent left radiocephalic fistula with depths and diameter measurements on attached diagram.  ___________________________________________ Conrad Schulenburg, MD  LT/MEDQ  D:  09/12/2010  T:  09/12/2010  Job:  [id number]

## 2010-09-22 ENCOUNTER — Ambulatory Visit (HOSPITAL_COMMUNITY)
Admission: RE | Admit: 2010-09-22 | Discharge: 2010-09-22 | Disposition: A | Discharge status: Home / Self Care | Payer: BC Managed Care – PPO | Source: Ambulatory Visit | Attending: Vascular Surgery | Admitting: Vascular Surgery

## 2010-09-22 DIAGNOSIS — Y832 Surgical operation with anastomosis, bypass or graft as the cause of abnormal reaction of the patient, or of later complication, without mention of misadventure at the time of the procedure: Secondary | ICD-10-CM | POA: Insufficient documentation

## 2010-09-22 DIAGNOSIS — T82598A Other mechanical complication of other cardiac and vascular devices and implants, initial encounter: Secondary | ICD-10-CM | POA: Insufficient documentation

## 2010-09-22 DIAGNOSIS — T82898A Other specified complication of vascular prosthetic devices, implants and grafts, initial encounter: Secondary | ICD-10-CM

## 2010-09-22 DIAGNOSIS — I428 Other cardiomyopathies: Secondary | ICD-10-CM | POA: Insufficient documentation

## 2010-09-22 DIAGNOSIS — I120 Hypertensive chronic kidney disease with stage 5 chronic kidney disease or end stage renal disease: Secondary | ICD-10-CM

## 2010-09-22 DIAGNOSIS — N186 End stage renal disease: Secondary | ICD-10-CM

## 2010-09-22 DIAGNOSIS — Z01812 Encounter for preprocedural laboratory examination: Secondary | ICD-10-CM | POA: Insufficient documentation

## 2010-09-22 DIAGNOSIS — I10 Essential (primary) hypertension: Secondary | ICD-10-CM | POA: Insufficient documentation

## 2010-09-22 LAB — POCT I-STAT 4, (NA,K, GLUC, HGB,HCT): Glucose, Bld: 96 mg/dL (ref 70–99)

## 2010-09-22 LAB — SURGICAL PCR SCREEN: MRSA, PCR: NEGATIVE

## 2010-09-22 LAB — HCG, SERUM, QUALITATIVE: Preg, Serum: NEGATIVE

## 2010-09-28 NOTE — Op Note (Signed)
Nicole Parrish, BRAUTIGAN NO.:  [id number]  MEDICAL RECORD NO.:  [id number]  LOCATION:  [facility]                         FACILITY:  [facility]  PHYSICIAN:  Nicole Foreston, MD       DATE OF BIRTH:  Mar 14, 1970  DATE OF PROCEDURE:  09/22/2010 DATE OF DISCHARGE:                              OPERATIVE REPORT   PROCEDURE:  Superficialization of left radiocephalic arteriovenous fistula.  PREOPERATIVE DIAGNOSIS:  Malfunctioning of left radiocephalic arteriovenous fistula.  POSTOPERATIVE DIAGNOSIS:  Malfunctioning of left radiocephalic arteriovenous fistula.  SURGEON:  Nicole Canalou, MD  ASSISTANT:  Nicole Furnace, PA-C  ANESTHESIA:  Monitored anesthesia care and local.  FINDINGS IN THIS CASE:  A deep forearm cephalic vein that is about 6-8 mm proximally and about 2.5 mm distally.  SPECIMENS:  None.  ESTIMATED BLOOD LOSS:  Minimal.  INDICATIONS:  This is a 40 year old patient with a left radiocephalic arteriovenous fistula that has been slow to mature.  She previously has undergone fistulogram which resulted in a ligation of competing branches.  I then allowed her to mature her fistula.  On SonoSite, it appeared to be adequately dilated but too deep for easy cannulation. They attempted cannulation previously but had not been successful.  Subsequently I recommended that she proceed with a superficialization procedure to try to make this fistula usable.  She is aware of the risks of this procedure to include bleeding, infection, and possible thrombosis of this fistula.  She is aware of these risks and agreed to proceed forward.  DESCRIPTION OF OPERATION:  After full informed written consent was obtained from the patient, the patient was brought back to the operating, and placed supine upon the operating table.  Prior to induction, she had received IV antibiotics.  She was then prepped and draped in the standard fashion for a left arm access procedure.  After  obtaining adequate sedation, I turned my attention first to her left wrist.  Under SonoSite guidance, I was able to mark out the path of the cephalic vein. I then anesthetized the pathway from the wrist all the way up to the antecubitum with a total of about 40 mL of a 1:1 mixture of 0.5% Marcaine without epinephrine and 1% lidocaine with epi.  I made an incision over the cephalic vein and dissected down through the subcutaneous tissue.  I was able to dissect out this cephalic vein.  Note that areas at this vein appeared to be quite scarred in place.  I had to do some additional lysis of tissue adjacent to the vein and in addition this vein was greater than 1 cm deep at some locations being as deep as close to 2 cm proximally.  After lysing the adjacent tissue around the vein, this vein was noted to be 6-8 mm in diameter proximally.  Distally it tapers down to about 2.5 mm.  There was a palpable thrill throughout this fistula.  I then laterally made a groove with electrocautery for this transposed vein.  I made certain that it was thin enough that I could easily feel the vein through the skin in the area of this subcutaneous tissue and transpose the cephalic vein into  this area, tacked it in place with a few loose stitches of 3-0 Vicryl.  The vein was loose throughout this tunneled groove.  I then reapproximated some of the subcutaneous tissue with interrupted stitches of 3-0 Vicryl.  The skin was then cleaned and dried.  The subcutaneous tissue was closed in a subdermal layer with a running stitch of 3-0 Vicryl.  The skin was then closed with a running subcuticular of 4-0 Vicryl.  It was cleaned, dried, and then Dermabond used to reinforce this.  The patient tolerated this procedure fine.  There were no complications in this case.  Condition was stable.     Nicole [place], MD     BLC/MEDQ  D:  09/22/2010  T:  09/22/2010  Job:  [id number]  Electronically Signed by Nicole Barthel MD on  09/28/2010 04:30:43 PM

## 2010-10-16 ENCOUNTER — Encounter: Payer: Self-pay | Admitting: Physician Assistant

## 2010-10-17 ENCOUNTER — Ambulatory Visit (INDEPENDENT_AMBULATORY_CARE_PROVIDER_SITE_OTHER): Payer: BC Managed Care – PPO | Admitting: Physician Assistant

## 2010-10-17 VITALS — BP 120/85 | HR 79 | Resp 18

## 2010-10-17 DIAGNOSIS — N186 End stage renal disease: Secondary | ICD-10-CM

## 2010-10-17 NOTE — Progress Notes (Signed)
Subjective:     Patient ID: Nicole Parrish, female   DOB: [birth year], 40 y.o.   MRN: [id number]  HPI  40 y/o AAF who originally had a left radiocephalic AVF placed 0000000.  In March, she had a fistulogram that revealed narrowing of the proximal forearm AVF likely secondary to intimal hyperplasia and competing branches.  She underwent ligation of those branches 06/03/10.  On September 22, 2010, she underwent superficialization of her left AVF.  She presents today for follow up.  She states the HD nurses stuck her AVF yesterday and it functioned for about 10 minutes before it infiltrated. Review of Systems     Objective:   Physical Exam     Filed Vitals:   10/17/10 1447  BP: 120/85  Pulse: 79  Resp: 18   She has a well healed incision on her left forearm. There is a very good thrill in her fistula and can be felt all the way up to the antecubital space. There is also a good bruit in her fistula as well. 2+ radial pulse No steal symptoms. Right IJ diatek in place.  Assessment:     40 y/o AAF who is s/p superficialization of her left AVF.  She has a great thrill and bruit.     Plan:    Dr. Bridgett Larsson evaluated the pt and suggested that they continue to try to use the AVF b/c it is functioning.  If her left radiocephalic AVF continues to malfunction, we will see her back in the office and decide upon further options.  Dr. Bridgett Larsson is attending    CC:  Dr. Detterding

## 2010-10-30 ENCOUNTER — Encounter: Payer: Self-pay | Admitting: Physician Assistant

## 2010-10-31 ENCOUNTER — Ambulatory Visit (INDEPENDENT_AMBULATORY_CARE_PROVIDER_SITE_OTHER): Payer: BC Managed Care – PPO | Admitting: Physician Assistant

## 2010-10-31 VITALS — BP 114/76 | HR 86

## 2010-10-31 DIAGNOSIS — Z992 Dependence on renal dialysis: Secondary | ICD-10-CM

## 2010-10-31 DIAGNOSIS — N186 End stage renal disease: Secondary | ICD-10-CM

## 2010-10-31 NOTE — Progress Notes (Signed)
[facility] [place]  Postoperative Access Visit  History of Present Illness  Nicole Parrish is a 40 y.o. year old female who presents for continued postoperative follow-up for left R-C AVF placed 12/06/09.  She subsequently had competing branches ligated and then a superficialization on 09/22/10.  At her first f/u she stated that it had infiltrated on HD once.  She has a right sided cath that can be used as needed.  Her fistula was well matured with an easily palp thrill up to the anticub space.  Dr. Bridgett Larsson felt they should continue to attempt to use the fistula and f/u in 2 weeks.   Today she states it infiltrated twice more but for the last 3 sessions they have been able to successfully stick and use the AVF for HD.  She dialyzes TThSat.   The patient denies steal symptoms.    Physical Examination  Filed Vitals:   10/31/10 1433  BP: 114/76  Pulse: 86   Left FA incision is well healed, skin feels warm, hand grip is 5/5, sensation in digits is intact, easily palpable thrill up to anticubital space, bruit can be auscultated. 2+ radial and ulnar pulses  Medical Decision Making  Nicole Parrish is a 40 y.o. year old female who presents s/p left R-C AVF with subsequent branch ligation and superficialization.  The patient's access should continue to be used on HD  The patient's tunneled dialysis catheter can be removed when the nephrologist is comfortable with the success of the AVF.  They will contact the office about removal of cath.  Pt can f/u prn  Clinic MD is Bridgett Larsson.

## 2010-11-24 ENCOUNTER — Encounter (HOSPITAL_COMMUNITY): Payer: BC Managed Care – PPO | Attending: Vascular Surgery

## 2010-11-24 DIAGNOSIS — N186 End stage renal disease: Secondary | ICD-10-CM

## 2010-11-24 DIAGNOSIS — Z452 Encounter for adjustment and management of vascular access device: Secondary | ICD-10-CM | POA: Insufficient documentation

## 2010-11-24 DIAGNOSIS — I120 Hypertensive chronic kidney disease with stage 5 chronic kidney disease or end stage renal disease: Secondary | ICD-10-CM | POA: Insufficient documentation

## 2010-12-08 ENCOUNTER — Ambulatory Visit: Payer: BC Managed Care – PPO | Admitting: Cardiology

## 2011-01-07 IMAGING — CR DG CHEST 1V PORT
1 series · 1 of 1 positions shown · non-contrast
Comparison: 10/24/2009

CLINICAL DATA: Insertion of dialysis catheter.  End-stage renal
disease.  585.6.

PORTABLE CHEST - 1 VIEW

[view not recorded]
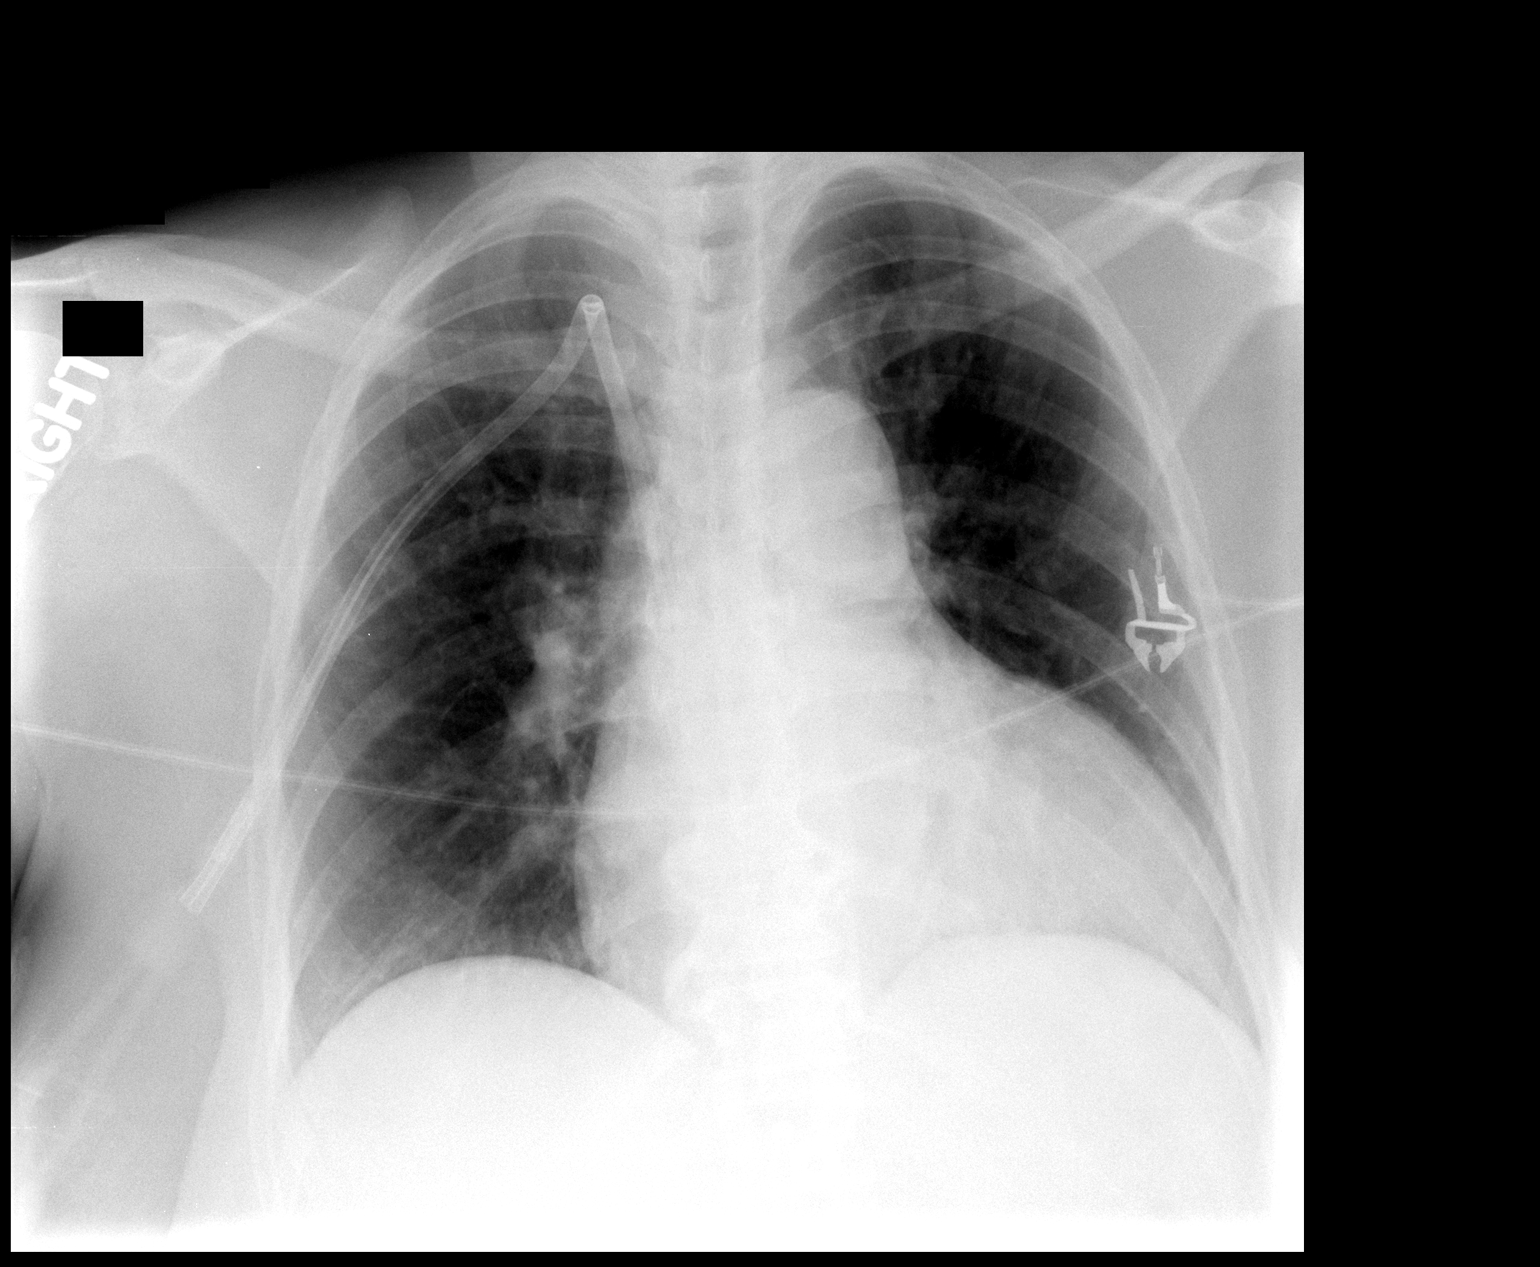

[1 of 1 positions shown; findings below may reference images not displayed]

FINDINGS: Double-lumen dialysis catheter has been inserted and the
tip is in the superior vena cava in good position.  There is no
pneumothorax.

There is cardiomegaly but the pulmonary vascularity is normal and
the lungs are clear.  No acute osseous abnormality.
IMPRESSION: Dialysis catheter appears in good position.  No pneumothorax.

## 2011-01-12 ENCOUNTER — Ambulatory Visit: Payer: BC Managed Care – PPO | Admitting: Cardiology

## 2011-05-25 ENCOUNTER — Other Ambulatory Visit: Payer: Self-pay | Admitting: *Deleted

## 2011-06-05 ENCOUNTER — Other Ambulatory Visit: Payer: Self-pay | Admitting: *Deleted

## 2011-06-11 MED ORDER — SODIUM CHLORIDE 0.9 % IJ SOLN: 3.0000 mL | INTRAMUSCULAR | Status: DC | PRN

## 2011-06-12 ENCOUNTER — Ambulatory Visit (HOSPITAL_COMMUNITY)
Admission: RE | Admit: 2011-06-12 | Discharge: 2011-06-12 | Disposition: A | Discharge status: Home / Self Care | Payer: BC Managed Care – PPO | Source: Ambulatory Visit | Attending: Surgery | Admitting: Surgery

## 2011-06-12 ENCOUNTER — Encounter (HOSPITAL_COMMUNITY)
Admission: RE | Disposition: A | Discharge status: Home / Self Care | Payer: Self-pay | Source: Ambulatory Visit | Attending: Surgery

## 2011-06-12 DIAGNOSIS — N189 Chronic kidney disease, unspecified: Secondary | ICD-10-CM | POA: Insufficient documentation

## 2011-06-12 DIAGNOSIS — Y832 Surgical operation with anastomosis, bypass or graft as the cause of abnormal reaction of the patient, or of later complication, without mention of misadventure at the time of the procedure: Secondary | ICD-10-CM | POA: Insufficient documentation

## 2011-06-12 DIAGNOSIS — I359 Nonrheumatic aortic valve disorder, unspecified: Secondary | ICD-10-CM | POA: Insufficient documentation

## 2011-06-12 DIAGNOSIS — N2581 Secondary hyperparathyroidism of renal origin: Secondary | ICD-10-CM | POA: Insufficient documentation

## 2011-06-12 DIAGNOSIS — T82898A Other specified complication of vascular prosthetic devices, implants and grafts, initial encounter: Secondary | ICD-10-CM | POA: Insufficient documentation

## 2011-06-12 DIAGNOSIS — I5030 Unspecified diastolic (congestive) heart failure: Secondary | ICD-10-CM | POA: Insufficient documentation

## 2011-06-12 DIAGNOSIS — I129 Hypertensive chronic kidney disease with stage 1 through stage 4 chronic kidney disease, or unspecified chronic kidney disease: Secondary | ICD-10-CM | POA: Insufficient documentation

## 2011-06-12 DIAGNOSIS — I509 Heart failure, unspecified: Secondary | ICD-10-CM | POA: Insufficient documentation

## 2011-06-12 HISTORY — PX: SHUNTOGRAM: SHX5491

## 2011-06-12 SURGERY — ASSESSMENT, SHUNT FUNCTION, WITH CONTRAST RADIOGRAPHIC STUDY
Anesthesia: LOCAL | Laterality: Left

## 2011-06-12 MED ORDER — LABETALOL HCL 5 MG/ML IV SOLN: 10.0000 mg | INTRAVENOUS | Status: DC | PRN

## 2011-06-12 MED ORDER — LIDOCAINE HCL (PF) 1 % IJ SOLN
INTRAMUSCULAR | Status: AC
  Filled 2011-06-12: qty 30

## 2011-06-12 MED ORDER — METOPROLOL TARTRATE 1 MG/ML IV SOLN: 2.0000 mg | INTRAVENOUS | Status: DC | PRN

## 2011-06-12 MED ORDER — HEPARIN (PORCINE) IN NACL 2-0.9 UNIT/ML-% IJ SOLN
INTRAMUSCULAR | Status: AC
  Filled 2011-06-12: qty 1000

## 2011-06-12 MED ORDER — DIPHENHYDRAMINE HCL 25 MG PO CAPS
25.0000 mg | ORAL_CAPSULE | Freq: Four times a day (QID) | ORAL | Status: DC | PRN
  Administered 2011-06-12: 25 mg via ORAL

## 2011-06-12 MED ORDER — ACETAMINOPHEN 325 MG PO TABS: 325.0000 mg | ORAL_TABLET | ORAL | Status: DC | PRN

## 2011-06-12 MED ORDER — ACETAMINOPHEN 325 MG RE SUPP: 325.0000 mg | RECTAL | Status: DC | PRN

## 2011-06-12 MED ORDER — DIPHENHYDRAMINE HCL 25 MG PO CAPS
ORAL_CAPSULE | ORAL | Status: AC
  Administered 2011-06-12: 25 mg via ORAL
  Filled 2011-06-12: qty 1

## 2011-06-12 MED ORDER — HYDRALAZINE HCL 20 MG/ML IJ SOLN: 10.0000 mg | INTRAMUSCULAR | Status: DC | PRN

## 2011-06-12 MED ORDER — ONDANSETRON HCL 4 MG/2ML IJ SOLN: 4.0000 mg | Freq: Four times a day (QID) | INTRAMUSCULAR | Status: DC | PRN

## 2011-06-12 MED ORDER — MIDAZOLAM HCL 2 MG/2ML IJ SOLN
INTRAMUSCULAR | Status: AC
  Filled 2011-06-12: qty 2

## 2011-06-12 NOTE — Progress Notes (Signed)
Elzie Rings, RN cath lab liason nurse advised patient plans to ride cab home and does not have current H&P

## 2011-06-12 NOTE — H&P (Signed)
[facility]ORT STAY H&P  CC:  Low flow fistula  HPI: low flow in fistula, prior radial cephalic placed by Dr Kellie Simmering 2011, superficialization after that, no problems recently  Past Medical History  Diagnosis Date  . Chronic kidney disease   . Hypertension   . CHF (congestive heart failure)     diastolic heart failure  . Mitral regurgitation     Moderate  . Secondary hyperparathyroidism (of renal origin)     FH:  Non-Contributory  Social HX History  Substance Use Topics  . Smoking status: Never Smoker   . Smokeless tobacco: Never Used  . Alcohol Use: No    Allergies No Known Allergies  Medications Current Facility-Administered Medications  Medication Dose Route Frequency Provider Last Rate Last Dose  . sodium chloride 0.9 % injection 3 mL  3 mL Intravenous PRN Serafina Mitchell, MD        PHYSICAL EXAM  Filed Vitals:   06/12/11 0652  BP: 105/66  Pulse: 65  Temp: 98.7 F (37.1 C)  Resp: 18    General:  WDWN in NAD HENT: WNL Eyes: Pupils equal Pulmonary: normal non-labored breathing , without Rales, rhonchi,  wheezing Cardiac: RRR, Vascular Exam/Pulses: left radial cephalic fistula + thrill  Extremities without ischemic changes, no Gangrene , no cellulitis; no open wounds;  Neuro A&O x 3; good sensation; motion in all extremities  Impression: Poor flow left AVF  Plan: Fistulogram possible intervention  Ruta Hinds, MD [facility] [place] Office: [phone] Pager: [phone]

## 2011-06-12 NOTE — Op Note (Signed)
Procedure: left radial cephalic fistulogram  Preoperative diagnosis: Poor flow AV fistula  Postoperative diagnosis: Same  Anesthesia: Local  Operative details: After obtaining informed consent, the patient was taken to the [place] lab. The patient was placed in supine position on the Angio table. Entire left upper extremity was prepped and draped in usual sterile fashion.   Local anesthesia was infiltrated over the proximal portion of the fistula in the left arm.   A micropuncture needle was brought up on the operative field and this was used to directly cannulate the fistula under ultrasound guidance. A micropuncture wire was then threaded into the fistula and the micropuncture sheath threaded over this. Sheath was thoroughly flushed with heparinized saline and the dilator was removed. Contrast angiogram was then performed of the fistula.   The central venous structures are patent. The fistula is patent in its midportion.  There is normal antecubital anatomy with patent basilic and cephalic vein.  There is aneurymal degeneration at one area of the proximal fistula with no narrowing.  There is a caliber change in vessel diameter of approximately 50% in the first 3 cm of the fistula.   Next, pressure was held on the upper arm in order to reflux contrast across the arterial anastomosis. The arterial anastomosis is patent with no stenosis.  Hemostasis was obtained with direct pressure.  The patient tolerated the procedure well and there were no complications. The patient was taken to the holding area in stable condition.  Operative findings: 50% caliber change proximal fistula over 3 cm of segment adjacent to the arterial anastomosis          Fistula is ready for use          Will schedule for revision of proximal fistula  Ruta Hinds, MD [facility] [place]: [phone] Pager: [phone]

## 2011-06-12 NOTE — Discharge Instructions (Signed)
AV Fistula Care After Refer to this sheet in the next few weeks. These instructions provide you with information on caring for yourself after your procedure. Your caregiver may also give you more specific instructions. Your treatment has been planned according to current medical practices, but problems sometimes occur. Call your caregiver if you have any problems or questions after your procedure. HOME CARE INSTRUCTIONS   Do not drive a car or take public transportation alone.   Do not drink alcohol.   Only take medicine that has been prescribed by your caregiver.   Do not sign important papers or make important decisions.   Have a responsible person with you.   Ask your caregiver to show you how to check your access at home for a vibration (called a "thrill") or for a sound (called a "bruit" pronounced brew-ee).   Your vein will need time to enlarge and mature so needles can be inserted for dialysis. Follow your caregiver's instructions about what you need to do to make this happen.   Keep dressings clean and dry.   Keep the arm elevated above your heart. Use a pillow.   Rest.   Use the arm as usual for all activities.   Have the stitches or tape closures removed in 10 to 14 days, or as directed by your caregiver.   Do not sleep or lie on the area of the fistula or that arm. This may decrease or stop the blood flow through your fistula.   Do not allow blood pressures to be taken on this arm.   Do not allow blood drawing to be done from the graft.   Do not wear tight clothing around the access site or on the arm.   Avoid lifting heavy objects with the arm that has the fistula.   Do not use creams or lotions over the access site.  SEEK MEDICAL CARE IF:   You have a fever.   You have swelling around the fistula that gets worse, or you have new pain.   You have unusual bleeding at the fistula site or from any other area.   You have pus or other drainage at the fistula  site.   You have skin redness or red streaking on the skin around, above, or below the fistula site.   Your access site feels warm.   You have any flu-like symptoms.  SEEK IMMEDIATE MEDICAL CARE IF:   You have pain, numbness, or an unusual pale skin on the hand or on the side of your fistula.   You have dizziness or weakness that you have not had before.   You have shortness of breath.   You have chest pain.   Your fistula disconnects or breaks, and there is bleeding that cannot be easily controlled.  Call for local emergency medical help. Do not try to drive yourself to the hospital. MAKE SURE YOU  Understand these instructions.   Will watch your condition.   Will get help right away if you are not doing well or get worse.  Document Released: 02/02/2005 Document Revised: 01/22/2011 Document Reviewed: 07/23/2010 Exit[employer] Patient Information 2012 [employer]. 

## 2011-06-15 LAB — POCT I-STAT, CHEM 8
BUN: 40 mg/dL — ABNORMAL HIGH (ref 6–23)
Chloride: 96 mEq/L (ref 96–112)
Creatinine, Ser: 5.8 mg/dL — ABNORMAL HIGH (ref 0.50–1.10)
Potassium: 4 mEq/L (ref 3.5–5.1)
Sodium: 134 mEq/L — ABNORMAL LOW (ref 135–145)
TCO2: 31 mmol/L (ref 0–100)

## 2011-06-23 ENCOUNTER — Other Ambulatory Visit: Payer: Self-pay | Admitting: *Deleted

## 2011-06-30 MED ORDER — DEXTROSE 5 % IV SOLN
1.5000 g | INTRAVENOUS | Status: DC
  Filled 2011-06-30: qty 1.5

## 2011-06-30 NOTE — Progress Notes (Signed)
Unable to reach patient on the phone, I left a message on voice mail with instructions for NPo after MN , report to Short Stay at 0730 and to take ToprolXL and Imdur.

## 2011-07-01 ENCOUNTER — Ambulatory Visit (HOSPITAL_COMMUNITY): Payer: BC Managed Care – PPO

## 2011-07-01 ENCOUNTER — Encounter (HOSPITAL_COMMUNITY): Payer: Self-pay | Admitting: *Deleted

## 2011-07-01 ENCOUNTER — Encounter (HOSPITAL_COMMUNITY): Payer: Self-pay | Admitting: Anesthesiology

## 2011-07-01 ENCOUNTER — Ambulatory Visit (HOSPITAL_COMMUNITY)
Admission: RE | Admit: 2011-07-01 | Discharge: 2011-07-01 | Disposition: A | Discharge status: Home / Self Care | Payer: BC Managed Care – PPO | Source: Ambulatory Visit | Attending: Vascular Surgery | Admitting: Vascular Surgery

## 2011-07-01 ENCOUNTER — Ambulatory Visit (HOSPITAL_COMMUNITY): Payer: BC Managed Care – PPO | Admitting: Anesthesiology

## 2011-07-01 ENCOUNTER — Encounter (HOSPITAL_COMMUNITY)
Admission: RE | Disposition: A | Discharge status: Home / Self Care | Payer: Self-pay | Source: Ambulatory Visit | Attending: Vascular Surgery

## 2011-07-01 DIAGNOSIS — I509 Heart failure, unspecified: Secondary | ICD-10-CM | POA: Insufficient documentation

## 2011-07-01 DIAGNOSIS — T82898A Other specified complication of vascular prosthetic devices, implants and grafts, initial encounter: Secondary | ICD-10-CM | POA: Insufficient documentation

## 2011-07-01 DIAGNOSIS — N186 End stage renal disease: Secondary | ICD-10-CM | POA: Insufficient documentation

## 2011-07-01 DIAGNOSIS — N2581 Secondary hyperparathyroidism of renal origin: Secondary | ICD-10-CM | POA: Insufficient documentation

## 2011-07-01 DIAGNOSIS — Y832 Surgical operation with anastomosis, bypass or graft as the cause of abnormal reaction of the patient, or of later complication, without mention of misadventure at the time of the procedure: Secondary | ICD-10-CM | POA: Insufficient documentation

## 2011-07-01 DIAGNOSIS — I120 Hypertensive chronic kidney disease with stage 5 chronic kidney disease or end stage renal disease: Secondary | ICD-10-CM | POA: Insufficient documentation

## 2011-07-01 DIAGNOSIS — I059 Rheumatic mitral valve disease, unspecified: Secondary | ICD-10-CM | POA: Insufficient documentation

## 2011-07-01 HISTORY — DX: Anemia, unspecified: D64.9

## 2011-07-01 LAB — POCT I-STAT 4, (NA,K, GLUC, HGB,HCT)
HCT: 36 % (ref 36.0–46.0)
Hemoglobin: 12.2 g/dL (ref 12.0–15.0)
Potassium: 4 mEq/L (ref 3.5–5.1)
Sodium: 136 mEq/L (ref 135–145)

## 2011-07-01 SURGERY — REVISON OF ARTERIOVENOUS FISTULA
Anesthesia: Monitor Anesthesia Care | Site: Arm Lower | Laterality: Left | Wound class: Clean

## 2011-07-01 MED ORDER — 0.9 % SODIUM CHLORIDE (POUR BTL) OPTIME
TOPICAL | Status: DC | PRN
  Administered 2011-07-01: 1000 mL

## 2011-07-01 MED ORDER — SODIUM CHLORIDE 0.9 % IV SOLN
INTRAVENOUS | Status: DC
  Administered 2011-07-01: 13:00:00 via INTRAVENOUS

## 2011-07-01 MED ORDER — SUFENTANIL CITRATE 50 MCG/ML IV SOLN
INTRAVENOUS | Status: DC | PRN
  Administered 2011-07-01: 20 ug via INTRAVENOUS

## 2011-07-01 MED ORDER — LIDOCAINE-EPINEPHRINE 0.5-1:200000 % IJ SOLN
INTRAMUSCULAR | Status: DC | PRN
  Administered 2011-07-01: 5 mL

## 2011-07-01 MED ORDER — MIDAZOLAM HCL 5 MG/5ML IJ SOLN
INTRAMUSCULAR | Status: DC | PRN
  Administered 2011-07-01: 1 mg via INTRAVENOUS

## 2011-07-01 MED ORDER — LIDOCAINE HCL (PF) 1 % IJ SOLN: INTRAMUSCULAR | Status: DC | PRN

## 2011-07-01 MED ORDER — MUPIROCIN 2 % EX OINT
TOPICAL_OINTMENT | CUTANEOUS | Status: AC
  Administered 2011-07-01: 1 via NASAL
  Filled 2011-07-01: qty 22

## 2011-07-01 MED ORDER — OXYCODONE HCL 5 MG PO TABS
5.0000 mg | ORAL_TABLET | Freq: Four times a day (QID) | ORAL | Status: AC | PRN
Start: 2011-07-01 — End: 2011-07-11

## 2011-07-01 MED ORDER — PROPOFOL 10 MG/ML IV EMUL
INTRAVENOUS | Status: DC | PRN
  Administered 2011-07-01: 150 mg via INTRAVENOUS

## 2011-07-01 MED ORDER — EPHEDRINE SULFATE 50 MG/ML IJ SOLN
INTRAMUSCULAR | Status: DC | PRN
  Administered 2011-07-01 (×4): 10 mg via INTRAVENOUS

## 2011-07-01 MED ORDER — ONDANSETRON HCL 4 MG/2ML IJ SOLN
INTRAMUSCULAR | Status: DC | PRN
  Administered 2011-07-01: 4 mg via INTRAVENOUS

## 2011-07-01 MED ORDER — LIDOCAINE HCL (CARDIAC) 20 MG/ML IV SOLN
INTRAVENOUS | Status: DC | PRN
  Administered 2011-07-01: 60 mg via INTRAVENOUS

## 2011-07-01 MED ORDER — SODIUM CHLORIDE 0.9 % IR SOLN
Status: DC | PRN
  Administered 2011-07-01: 14:00:00

## 2011-07-01 MED ORDER — CEFUROXIME SODIUM 1.5 G IJ SOLR
1.5000 g | INTRAMUSCULAR | Status: DC | PRN
  Administered 2011-07-01: 1.5 g via INTRAVENOUS

## 2011-07-01 MED ORDER — SODIUM CHLORIDE 0.9 % IV SOLN
INTRAVENOUS | Status: DC | PRN
  Administered 2011-07-01: 14:00:00 via INTRAVENOUS

## 2011-07-01 SURGICAL SUPPLY — 46 items
ADH SKN CLS APL DERMABOND .7 (GAUZE/BANDAGES/DRESSINGS) ×1
APL SKNCLS STERI-STRIP NONHPOA (GAUZE/BANDAGES/DRESSINGS) ×1
BENZOIN TINCTURE PRP APPL 2/3 (GAUZE/BANDAGES/DRESSINGS) ×1 IMPLANT
CANISTER SUCTION 2500CC (MISCELLANEOUS) ×2 IMPLANT
CLIP TI MEDIUM 6 (CLIP) ×2 IMPLANT
CLIP TI WIDE RED SMALL 6 (CLIP) ×2 IMPLANT
CLOTH BEACON ORANGE TIMEOUT ST (SAFETY) ×2 IMPLANT
CLSR STERI-STRIP ANTIMIC 1/2X4 (GAUZE/BANDAGES/DRESSINGS) ×1 IMPLANT
COVER PROBE W GEL 5X96 (DRAPES) ×2 IMPLANT
COVER SURGICAL LIGHT HANDLE (MISCELLANEOUS) ×4 IMPLANT
DECANTER SPIKE VIAL GLASS SM (MISCELLANEOUS) ×2 IMPLANT
DERMABOND ADVANCED (GAUZE/BANDAGES/DRESSINGS) ×1
DERMABOND ADVANCED .7 DNX12 (GAUZE/BANDAGES/DRESSINGS) ×1 IMPLANT
DRAIN PENROSE 1/4X12 LTX STRL (WOUND CARE) ×2 IMPLANT
ELECT REM PT RETURN 9FT ADLT (ELECTROSURGICAL) ×2
ELECTRODE REM PT RTRN 9FT ADLT (ELECTROSURGICAL) ×1 IMPLANT
GAUZE SPONGE 2X2 8PLY STRL LF (GAUZE/BANDAGES/DRESSINGS) ×1 IMPLANT
GEL ULTRASOUND 20GR AQUASONIC (MISCELLANEOUS) IMPLANT
GLOVE BIO SURGEON STRL SZ7.5 (GLOVE) ×2 IMPLANT
GLOVE BIOGEL PI IND STRL 6.5 (GLOVE) IMPLANT
GLOVE BIOGEL PI IND STRL 7.5 (GLOVE) IMPLANT
GLOVE BIOGEL PI INDICATOR 6.5 (GLOVE) ×1
GLOVE BIOGEL PI INDICATOR 7.5 (GLOVE) ×2
GLOVE SS BIOGEL STRL SZ 7.5 (GLOVE) IMPLANT
GLOVE SUPERSENSE BIOGEL SZ 7.5 (GLOVE) ×1
GLOVE SURG SS PI 7.5 STRL IVOR (GLOVE) ×1 IMPLANT
GOWN PREVENTION PLUS XLARGE (GOWN DISPOSABLE) ×3 IMPLANT
GOWN STRL NON-REIN LRG LVL3 (GOWN DISPOSABLE) ×4 IMPLANT
KIT BASIN OR (CUSTOM PROCEDURE TRAY) ×2 IMPLANT
KIT ROOM TURNOVER OR (KITS) ×2 IMPLANT
LOOP VESSEL MINI RED (MISCELLANEOUS) IMPLANT
NS IRRIG 1000ML POUR BTL (IV SOLUTION) ×2 IMPLANT
PACK CV ACCESS (CUSTOM PROCEDURE TRAY) ×2 IMPLANT
PAD ARMBOARD 7.5X6 YLW CONV (MISCELLANEOUS) ×4 IMPLANT
SPONGE GAUZE 2X2 STER 10/PKG (GAUZE/BANDAGES/DRESSINGS) ×1
SPONGE GAUZE 4X4 12PLY (GAUZE/BANDAGES/DRESSINGS) ×1 IMPLANT
SPONGE SURGIFOAM ABS GEL 100 (HEMOSTASIS) IMPLANT
SUT PROLENE 7 0 BV 1 (SUTURE) ×2 IMPLANT
SUT VIC AB 3-0 SH 27 (SUTURE) ×2
SUT VIC AB 3-0 SH 27X BRD (SUTURE) ×1 IMPLANT
SUT VICRYL 4-0 PS2 18IN ABS (SUTURE) ×2 IMPLANT
TAPE CLOTH SURG 4X10 WHT LF (GAUZE/BANDAGES/DRESSINGS) ×1 IMPLANT
TOWEL OR 17X24 6PK STRL BLUE (TOWEL DISPOSABLE) ×2 IMPLANT
TOWEL OR 17X26 10 PK STRL BLUE (TOWEL DISPOSABLE) ×2 IMPLANT
UNDERPAD 30X30 INCONTINENT (UNDERPADS AND DIAPERS) ×2 IMPLANT
WATER STERILE IRR 1000ML POUR (IV SOLUTION) ×2 IMPLANT

## 2011-07-01 NOTE — Op Note (Signed)
OPERATIVE REPORT  DATE OF SURGERY: 07/01/2011  PATIENT: Nicole Parrish, 41 y.o. female MRN: [id number]  DOB: [birth year]  PRE-OPERATIVE DIAGNOSIS: End-stage renal disease with poorly functioning left Cimino AV fistula  POST-OPERATIVE DIAGNOSIS:  Same  PROCEDURE: Revision of Cimino fistula with a reanastomosed in the cephalic vein to the more proximal radial artery  SURGEON:  Curt Jews, M.D.  PHYSICIAN ASSISTANT: Collins  ANESTHESIA:  Gen.  EBL: Minimal ml  Total I/O In: 200 [I.V.:200] Out: -   BLOOD ADMINISTERED: None  DRAINS: None  SPECIMEN: None  COUNTS CORRECT:  YES  PLAN OF CARE: PACU   PATIENT DISPOSITION:  PACU - hemodynamically stable  PROCEDURE DETAILS: Patient taken up and placed supine position where the area the right was prepped and draped in the usual sterile fashion. An incision was made over the cephalic vein to radial artery anastomosis. A shuntogram H. and stenosis of the proximal several centimeters of this vein. The vein distal this was of large caliber. The vein was exposed proximal to the area of stenosis. The radial artery was exposed further proximally as well and was of good caliber. The vein was ligated at the old radial anastomosis and divided. The area of sclerotic vein was transected. The vein was flushed with heparinized and reoccluded. The vein was brought into approximation to the more proximal radial artery. The radial artery was occluded proximally and distally with vascular clamps and was opened with an 11 blade and extended longitudinally with Potts scissors. The vein was sewn end-to-side to the artery with a running 6-0 Prolene suture. Clamps removed and good thrill was noted. The wounds were irrigated with saline. Hemostasis obtained electrocautery. The wounds were closed with 3-0 Vicryl in the subcutaneous and subcuticular tissue area and benzoin and Steri-Strips were applied  Curt Jews, M.D. 07/01/2011 3:27 PM

## 2011-07-01 NOTE — Interval H&P Note (Signed)
History and Physical Interval Note:  07/01/2011 1:38 PM  Nicole Parrish  has presented today for surgery, with the diagnosis of ESRD  The various methods of treatment have been discussed with the patient and family. After consideration of risks, benefits and other options for treatment, the patient has consented to  Procedure(s) (LRB): REVISON OF ARTERIOVENOUS FISTULA (Left) as a surgical intervention .  The patients' history has been reviewed, patient examined, no change in status, stable for surgery.  I have reviewed the patients' chart and labs.  Questions were answered to the patient's satisfaction.    Angio shows proximal stenosis.  For surgical revision Elmar Antigua

## 2011-07-01 NOTE — Anesthesia Preprocedure Evaluation (Signed)
Anesthesia Evaluation  Patient identified by MRN, date of birth, ID band Patient awake    Reviewed: Allergy & Precautions, H&P , NPO status , Patient's Chart, lab work & pertinent test results  History of Anesthesia Complications Negative for: history of anesthetic complications  Airway Mallampati: I TM Distance: >3 FB Neck ROM: Full    Dental  (+) Teeth Intact and Dental Advisory Given   Pulmonary neg pulmonary ROS,  breath sounds clear to auscultation  Pulmonary exam normal       Cardiovascular hypertension, Pt. on medications and Pt. on home beta blockers +CHF Rhythm:Regular Rate:Normal     Neuro/Psych    GI/Hepatic negative GI ROS, Neg liver ROS,   Endo/Other    Renal/GU CRFRenal disease     Musculoskeletal   Abdominal   Peds  Hematology   Anesthesia Other Findings   Reproductive/Obstetrics                           Anesthesia Physical Anesthesia Plan  ASA: III  Anesthesia Plan: General   Post-op Pain Management:    Induction: Intravenous  Airway Management Planned: LMA  Additional Equipment:   Intra-op Plan:   Post-operative Plan: Extubation in OR  Informed Consent: I have reviewed the patients History and Physical, chart, labs and discussed the procedure including the risks, benefits and alternatives for the proposed anesthesia with the patient or authorized representative who has indicated his/her understanding and acceptance.   Dental advisory given  Plan Discussed with: CRNA, Anesthesiologist and Surgeon  Anesthesia Plan Comments:         Anesthesia Quick Evaluation

## 2011-07-01 NOTE — Transfer of Care (Signed)
Immediate Anesthesia Transfer of Care Note  Patient: Nicole Parrish  Procedure(s) Performed: Procedure(s) (LRB): REVISON OF ARTERIOVENOUS FISTULA (Left)  Patient Location: PACU  Anesthesia Type: General  Level of Consciousness: awake, alert , oriented and patient cooperative  Airway & Oxygen Therapy: Patient Spontanous Breathing and Patient connected to face mask oxygen  Post-op Assessment: Report given to PACU RN  Post vital signs: Reviewed and stable  Complications: No apparent anesthesia complications

## 2011-07-01 NOTE — Interval H&P Note (Signed)
History and Physical Interval Note:  07/01/2011 1:39 PM  Nicole Parrish  has presented today for surgery, with the diagnosis of ESRD  The various methods of treatment have been discussed with the patient and family. After consideration of risks, benefits and other options for treatment, the patient has consented to  Procedure(s) (LRB): REVISON OF ARTERIOVENOUS FISTULA (Left) as a surgical intervention .  The patients' history has been reviewed, patient examined, no change in status, stable for surgery.  I have reviewed the patients' chart and labs.  Questions were answered to the patient's satisfaction.     Shayleigh Bouldin

## 2011-07-01 NOTE — Anesthesia Postprocedure Evaluation (Signed)
  Anesthesia Post-op Note  Patient: Nicole Parrish  Procedure(s) Performed: Procedure(s) (LRB): REVISON OF ARTERIOVENOUS FISTULA (Left)  Patient Location: PACU  Anesthesia Type: General  Level of Consciousness: awake, alert  and oriented  Airway and Oxygen Therapy: Patient Spontanous Breathing and Patient connected to nasal cannula oxygen  Post-op Pain: none  Post-op Assessment: Post-op Vital signs reviewed and Patient's Cardiovascular Status Stable  Post-op Vital Signs: stable  Complications: No apparent anesthesia complications

## 2011-07-01 NOTE — H&P (View-Only) (Signed)
[facility] SHORT STAY H&P  CC:  Low flow fistula  HPI: low flow in fistula, prior radial cephalic placed by Dr Kellie Simmering 2011, superficialization after that, no problems recently  Past Medical History  Diagnosis Date  . Chronic kidney disease   . Hypertension   . CHF (congestive heart failure)     diastolic heart failure  . Mitral regurgitation     Moderate  . Secondary hyperparathyroidism (of renal origin)     FH:  Non-Contributory  Social HX History  Substance Use Topics  . Smoking status: Never Smoker   . Smokeless tobacco: Never Used  . Alcohol Use: No    Allergies No Known Allergies  Medications Current Facility-Administered Medications  Medication Dose Route Frequency Provider Last Rate Last Dose  . sodium chloride 0.9 % injection 3 mL  3 mL Intravenous PRN Serafina Mitchell, MD        PHYSICAL EXAM  Filed Vitals:   06/12/11 0652  BP: 105/66  Pulse: 65  Temp: 98.7 F (37.1 C)  Resp: 18    General:  WDWN in NAD HENT: WNL Eyes: Pupils equal Pulmonary: normal non-labored breathing , without Rales, rhonchi,  wheezing Cardiac: RRR, Vascular Exam/Pulses: left radial cephalic fistula + thrill  Extremities without ischemic changes, no Gangrene , no cellulitis; no open wounds;  Neuro A&O x 3; good sensation; motion in all extremities  Impression: Poor flow left AVF  Plan: Fistulogram possible intervention  Ruta Hinds, MD [facility] [place] Office: [phone] Pager: [phone]

## 2011-09-15 IMAGING — US IR SHUNTOGRAM/ FISTULAGRAM
1 series · 1 of 1 positions shown · non-contrast
Comparison: 03/31/2010

CLINICAL DATA: end stage renal disease, decreased flows, frequent
infiltration

DIALYSIS FISTULOGRAM
TECHNIQUE: Percutaneous access could not be achieved.  For this
reason, under real time ultrasound guidance the outflow vein of the
fistula was accessed with a 21-gauge micropuncture needle,
exchanged over a guide wire for a micropuncture dilator for
fistulography. Ultrasound documentation was stored.

[Series 1: ir shuntogram/ fistulagram · 1 of 1 slices shown]
[im 1/1]
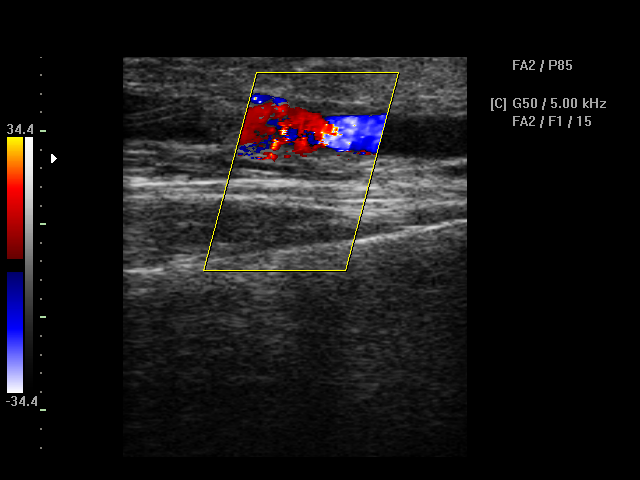

[1 of 1 positions shown; findings below may reference images not displayed]

FINDINGS: The outflow vein of the fistula is widely patent.
Central venous structures through the SVC widely patent.  Reflux
across the fistula with the brachial artery at the wrist shows this
to be widely patent.  There is no significant competing outflow
vein.

IMPRESSION

1.  No significant stenosis or occlusion.

Access management: Remains approachable for percutaneous
intervention as needed.

## 2011-09-22 ENCOUNTER — Emergency Department (INDEPENDENT_AMBULATORY_CARE_PROVIDER_SITE_OTHER): Payer: BC Managed Care – PPO

## 2011-09-22 ENCOUNTER — Encounter (HOSPITAL_COMMUNITY): Payer: Self-pay

## 2011-09-22 ENCOUNTER — Emergency Department (HOSPITAL_COMMUNITY)
Admission: EM | Admit: 2011-09-22 | Discharge: 2011-09-22 | Disposition: A | Discharge status: Home / Self Care | Payer: BC Managed Care – PPO | Source: Home / Self Care | Attending: Emergency Medicine | Admitting: Emergency Medicine

## 2011-09-22 DIAGNOSIS — S335XXA Sprain of ligaments of lumbar spine, initial encounter: Secondary | ICD-10-CM

## 2011-09-22 DIAGNOSIS — S39012A Strain of muscle, fascia and tendon of lower back, initial encounter: Secondary | ICD-10-CM

## 2011-09-22 HISTORY — DX: Dependence on renal dialysis: Z99.2

## 2011-09-22 MED ORDER — MELOXICAM 15 MG PO TABS: 15.0000 mg | ORAL_TABLET | Freq: Every day | ORAL | Status: AC

## 2011-09-22 MED ORDER — HYDROCODONE-ACETAMINOPHEN 5-325 MG PO TABS: 2.0000 | ORAL_TABLET | ORAL | Status: AC | PRN

## 2011-09-22 MED ORDER — METAXALONE 800 MG PO TABS: 800.0000 mg | ORAL_TABLET | Freq: Three times a day (TID) | ORAL | Status: AC

## 2011-09-22 NOTE — ED Notes (Signed)
Earlier today, while in chair at dialysis clinic, the chair she was seated in for treatment collapsed , going from upright seated to laying on her back (did not fall from chair, remained in chair) was assisted to another chair for her treatment, but had to leave early, since her back was hurting; NAD at present

## 2011-09-22 NOTE — ED Notes (Signed)
Form for PT completed and forwarded to appointment nurse for completion ; will call pt ASAP when first appointment scheduled

## 2011-09-22 NOTE — ED Provider Notes (Signed)
History     CSN: [id number]  Arrival date & time 09/22/11  1627   First MD Initiated Contact with Patient 09/22/11 1728      Chief Complaint  Patient presents with  . Fall    (Consider location/radiation/quality/duration/timing/severity/associated sxs/prior treatment) HPI Comments: Pt states she was at dialysis, and the seat back of chair in which she was sitting unexpectedly fell flat. She fell with it, jarring her lower back. She did not fall out of the chair. She was only able to complete an hour of dialysis due to achy lower nonradiating back pain worse with sitting still for prolonged periods of time. No h/o injury to this area.   ROS as noted in HPI. All other ROS negative.   Patient is a 41 y.o. female presenting with back pain. The history is provided by the patient. No language interpreter was used.  Back Pain  This is a new problem. The current episode started 3 to 5 hours ago. The problem occurs constantly. The problem has not changed since onset.The pain is associated with falling. The pain is present in the lumbar spine. The quality of the pain is described as aching. The pain does not radiate. Pertinent negatives include no chest pain, no fever, no numbness, no abdominal pain, no abdominal swelling, no bowel incontinence, no perianal numbness, no bladder incontinence, no pelvic pain, no leg pain, no paresthesias, no paresis, no tingling and no weakness. She has tried nothing for the symptoms. The treatment provided no relief.    Past Medical History  Diagnosis Date  . Hypertension   . CHF (congestive heart failure)     diastolic heart failure  . Mitral regurgitation     Moderate  . Secondary hyperparathyroidism (of renal origin)   . Anemia   . Dialysis patient   . Chronic kidney disease     Tues, Th, Sat-[address]-last done Tues    Past Surgical History  Procedure Date  . Av fistula placement, radiocephalic [id number]    Left arm   By Dr. Kellie Simmering  . Av fistula  repair 06/03/10    Ligation of competing branches Left AVF By Dr. Bridgett Larsson  . Cystectomy     left under arm    History reviewed. No pertinent family history.  History  Substance Use Topics  . Smoking status: Never Smoker   . Smokeless tobacco: Never Used  . Alcohol Use: No    OB History    Grav Para Term Preterm Abortions TAB SAB Ect Mult Living                  Review of Systems  Constitutional: Negative for fever.  Cardiovascular: Negative for chest pain.  Gastrointestinal: Negative for abdominal pain and bowel incontinence.  Genitourinary: Negative for bladder incontinence and pelvic pain.  Musculoskeletal: Positive for back pain.  Neurological: Negative for tingling, weakness, numbness and paresthesias.    Allergies  Review of patient's allergies indicates no known allergies.  Home Medications   Current Outpatient Rx  Name Route Sig Dispense Refill  . ASPIRIN 81 MG PO TABS Oral Take 81 mg by mouth daily.      Marland Kitchen NEPHRO-VITE 0.8 MG PO TABS Oral Take 0.8 mg by mouth every morning.     Marland Kitchen CALCIUM ACETATE 667 MG PO CAPS Oral Take 2,001 mg by mouth 3 (three) times daily.     Marland Kitchen HYDROCODONE-ACETAMINOPHEN 5-325 MG PO TABS Oral Take 2 tablets by mouth every 4 (four) hours as needed  for pain. 20 tablet 0  . ISOSORBIDE MONONITRATE ER 30 MG PO TB24 Oral Take 30 mg by mouth daily.      Marland Kitchen LIDOCAINE-PRILOCAINE 2.5-2.5 % EX CREA Topical Apply 1 application topically as needed.     . MELOXICAM 15 MG PO TABS Oral Take 1 tablet (15 mg total) by mouth daily. 14 tablet 0  . METAXALONE 800 MG PO TABS Oral Take 1 tablet (800 mg total) by mouth 3 (three) times daily. 21 tablet 0  . METOPROLOL SUCCINATE ER 100 MG PO TB24 Oral Take 100 mg by mouth at bedtime. Take with or immediately following a meal.      BP 116/72  Pulse 78  Temp 99.3 F (37.4 C) (Oral)  Resp 20  SpO2 100%  Physical Exam  Nursing note and vitals reviewed. Constitutional: She is oriented to person, place, and time. She  appears well-developed and well-nourished. No distress.  HENT:  Head: Normocephalic and atraumatic.  Eyes: Conjunctivae and EOM are normal.  Neck: Normal range of motion.  Cardiovascular: Normal rate.   Pulmonary/Chest: Effort normal.  Abdominal: She exhibits no distension.  Musculoskeletal: Normal range of motion.       Lumbar back: She exhibits pain and spasm. She exhibits no bony tenderness.       Back:  Neurological: She is alert and oriented to person, place, and time. Coordination normal.  Skin: Skin is warm and dry.  Psychiatric: She has a normal mood and affect. Her behavior is normal. Judgment and thought content normal.    ED Course  Procedures (including critical care time)  Labs Reviewed - No data to display Dg Lumbar Spine 2-3 Views  09/22/2011  *RADIOLOGY REPORT*  Clinical Data: Back pain, injury  LUMBAR SPINE - 2-3 VIEW  Comparison: None.  Findings: Mild degenerative changes of the lower thoracic spine. Normal alignment.  No fracture, wedge shaped deformity or focal kyphosis.  Preserved vertebral body heights and disc spaces. Nonobstructive bowel gas pattern.  Intact pedicles.  Normal SI joints.  IMPRESSION: No acute finding.  Original Report Authenticated By: Jerilynn Mages. Daryll Brod, M.D.     1. Lumbar strain     MDM  Exam c/w msk strain no bony tenderness. Imaging reviewed by myself. Report per radiologist.  Nsaids, norco, flexaril. Referring to [facility] if no improvement with conservative tx and home exercises. She will move her dialysis up a day as she was unable to complete tx today  Cherly Beach, MD 09/24/11 [phone]

## 2011-10-07 ENCOUNTER — Telehealth (HOSPITAL_COMMUNITY): Payer: Self-pay | Admitting: *Deleted

## 2011-10-07 NOTE — ED Notes (Signed)
I called pt. To see if she has started her PT with [facility]. Roselyn Meier 10/07/2011

## 2011-10-09 ENCOUNTER — Telehealth (HOSPITAL_COMMUNITY): Payer: Self-pay | Admitting: *Deleted

## 2011-10-09 NOTE — ED Notes (Addendum)
8/7 Referral form for [facility] faxed to [phone] and confirmation received.  I called pt. on 8/21 and 8/23 to see if she has started tx., but pt. has not called back.  I called [facility] and she said she is not in their system. They got our referral.  They called 8/12 and pt.did not call back. I gave her another phone number to try.  Roselyn Meier 10/09/2011

## 2011-10-09 NOTE — ED Notes (Signed)
I called pt. and left a message to call.

## 2011-10-12 ENCOUNTER — Telehealth (HOSPITAL_COMMUNITY): Payer: Self-pay | Admitting: *Deleted

## 2011-10-12 ENCOUNTER — Other Ambulatory Visit: Payer: Self-pay | Admitting: Family Medicine

## 2011-10-12 DIAGNOSIS — Z1231 Encounter for screening mammogram for malignant neoplasm of breast: Secondary | ICD-10-CM

## 2011-10-12 NOTE — ED Notes (Signed)
Pt. called back and said [facility] contacted her and she has her first appointment on 8/28 @ 1100.  Message sent to Dr. Alphonzo Cruise that referral was completed. Roselyn Meier 10/12/2011

## 2011-11-16 ENCOUNTER — Ambulatory Visit
Admission: RE | Admit: 2011-11-16 | Discharge: 2011-11-16 | Disposition: A | Discharge status: Home / Self Care | Payer: BC Managed Care – PPO | Source: Ambulatory Visit | Attending: Family Medicine | Admitting: Family Medicine

## 2011-11-16 DIAGNOSIS — Z1231 Encounter for screening mammogram for malignant neoplasm of breast: Secondary | ICD-10-CM

## 2012-08-02 IMAGING — CR DG CHEST 2V
2 series · 2 of 2 positions shown · non-contrast
Comparison: 12/05/2009

CLINICAL DATA: Preoperative assessment for left AV fistula
revision, history hypertension, end-stage renal disease

CHEST - 2 VIEW

[view not recorded (1 of 2)]
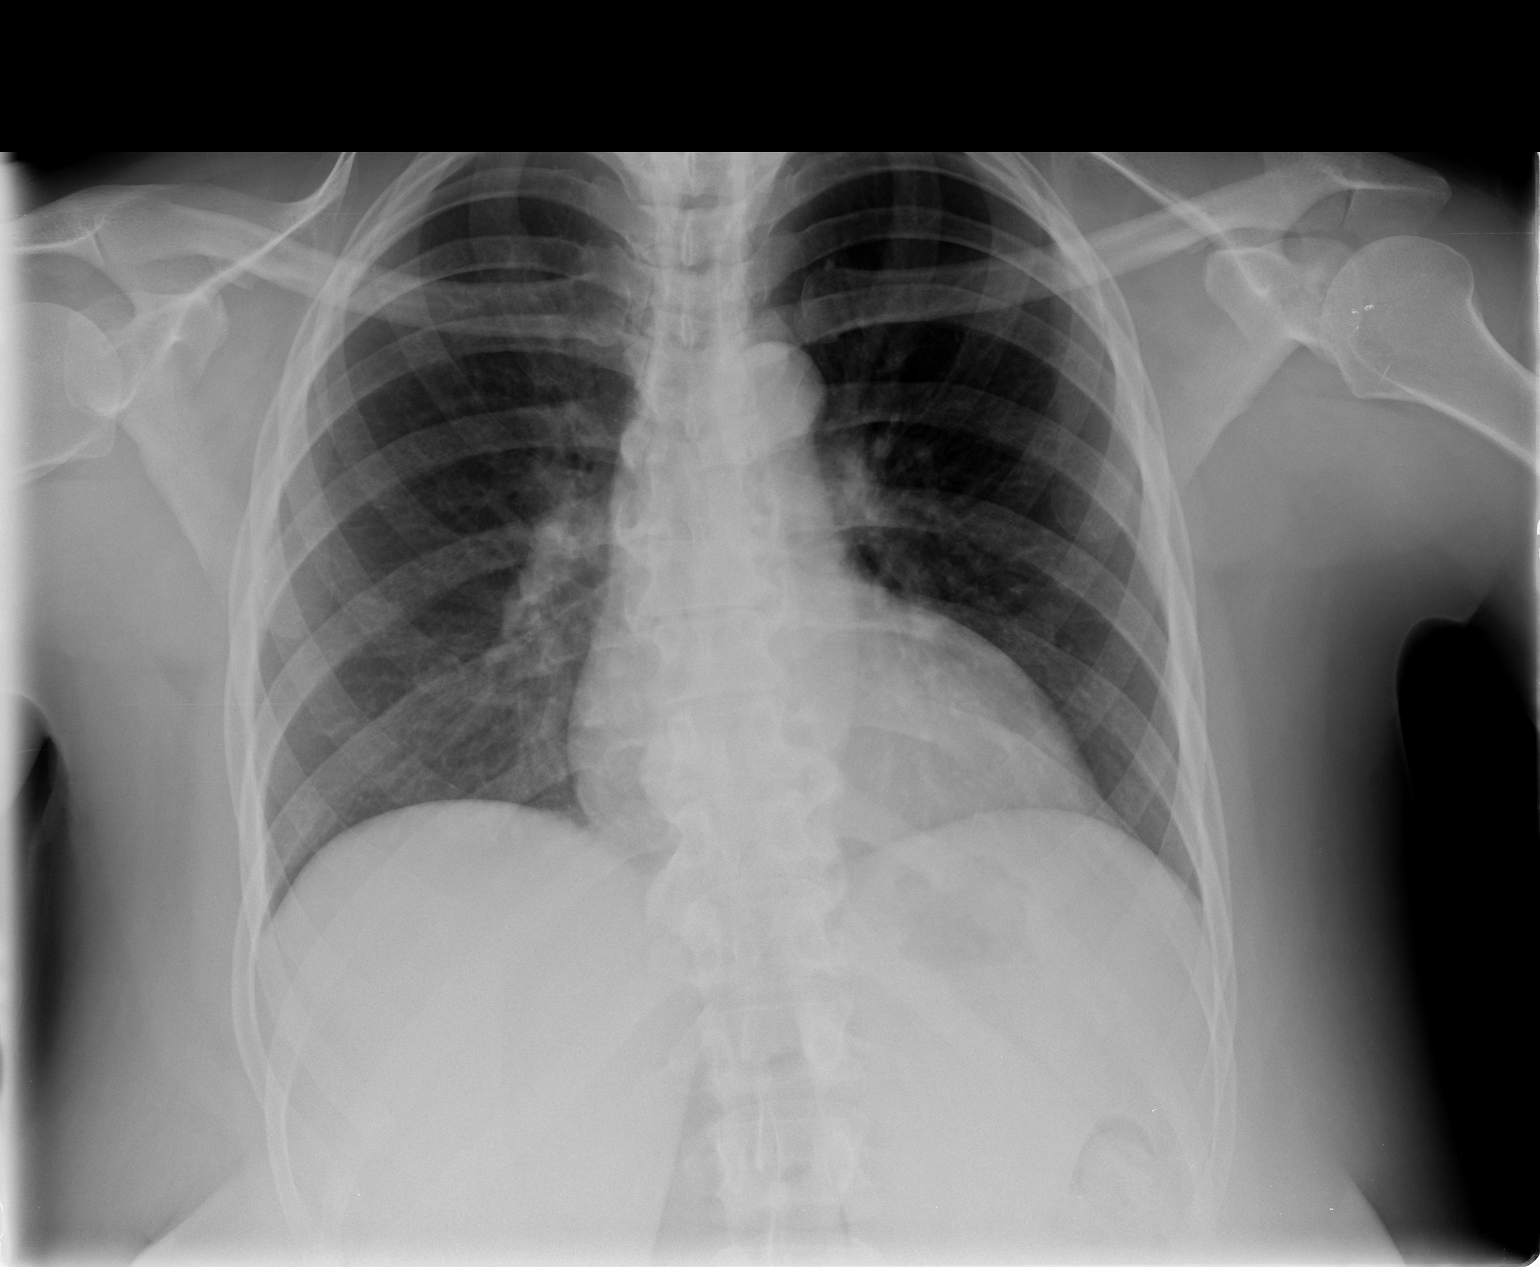

[view not recorded (2 of 2)]
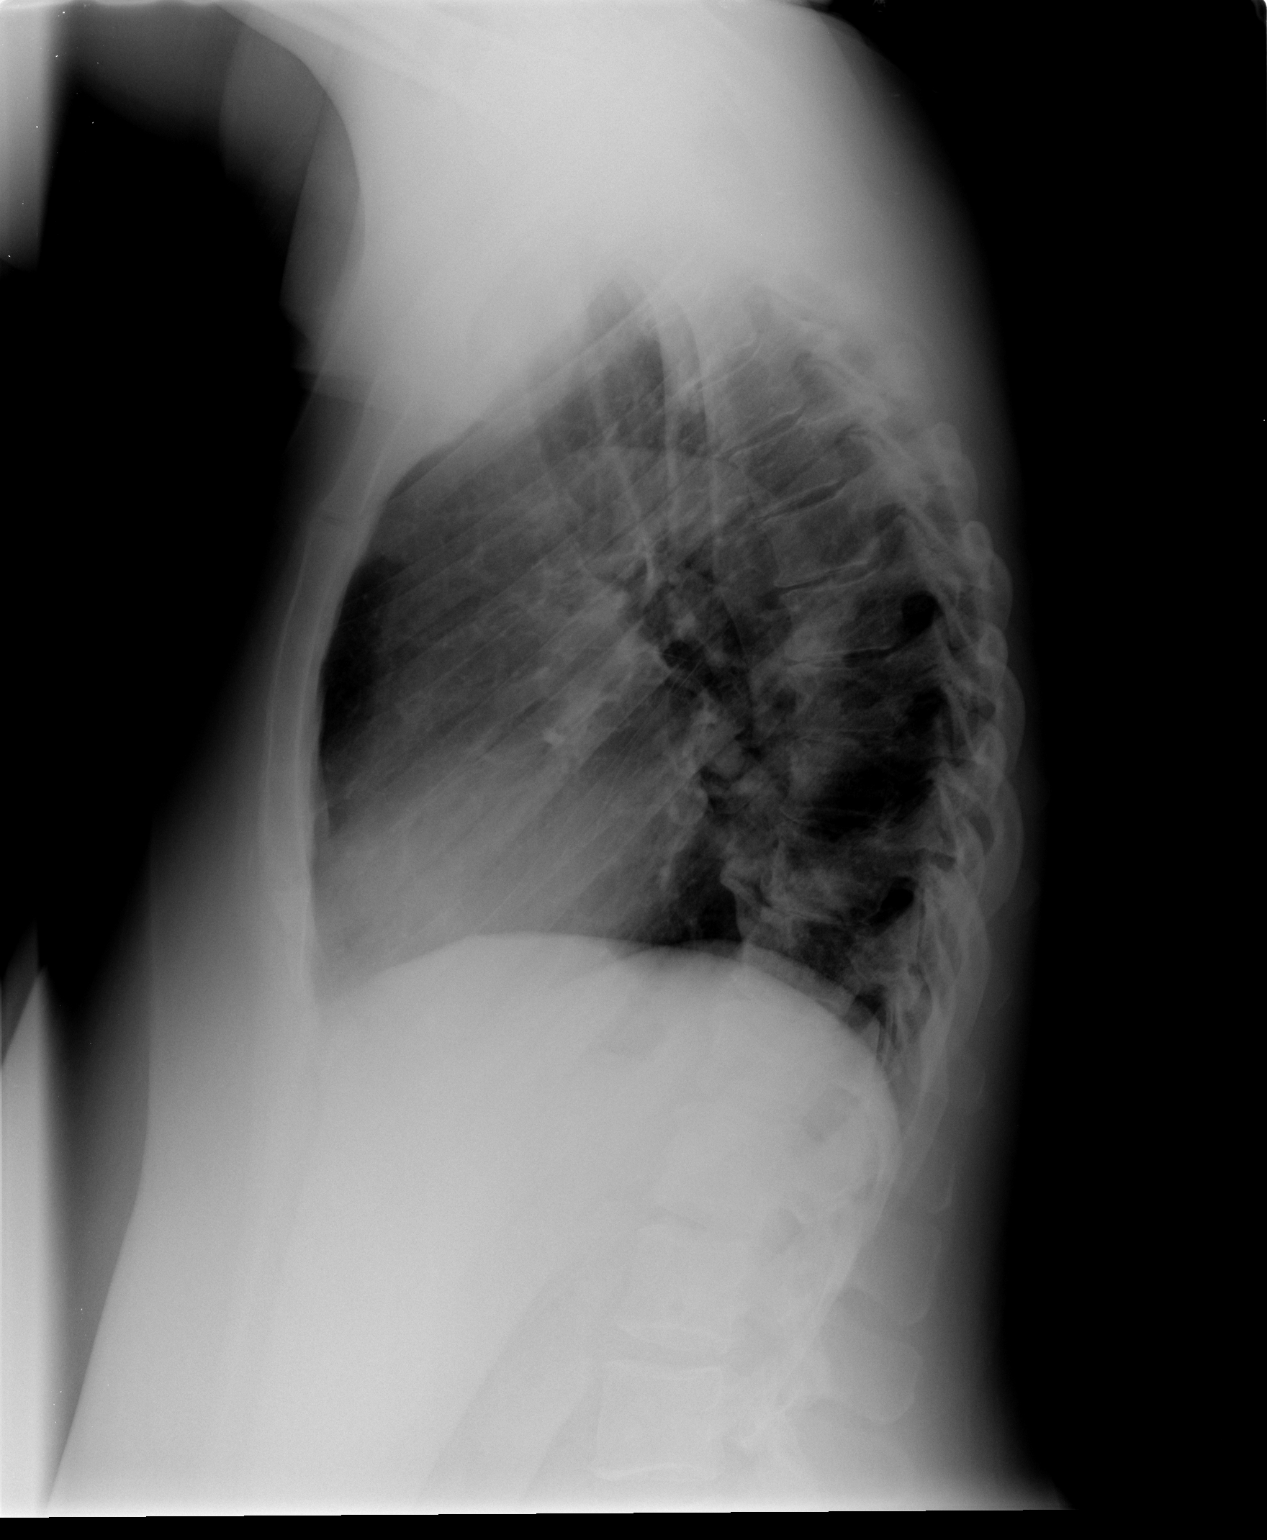

[2 of 2 positions shown; findings below may reference images not displayed]

FINDINGS: Upper-normal size of cardiac silhouette.
Mildly tortuous thoracic aorta.
Mediastinal contours and pulmonary vascularity otherwise normal.
Lungs clear.
No pleural effusion or pneumothorax.
Scattered end plate spur formation thoracic spine with associated
mild biconvex thoracolumbar scoliosis.
IMPRESSION: No acute abnormalities.

## 2014-01-25 ENCOUNTER — Encounter (HOSPITAL_COMMUNITY): Payer: Self-pay | Admitting: Vascular Surgery

## 2015-06-21 ENCOUNTER — Encounter: Payer: Self-pay | Admitting: Vascular Surgery

## 2015-06-28 ENCOUNTER — Ambulatory Visit: Payer: Self-pay | Admitting: Vascular Surgery

## 2015-11-22 ENCOUNTER — Telehealth: Payer: Self-pay

## 2015-11-22 ENCOUNTER — Encounter: Payer: Self-pay | Admitting: Vascular Surgery

## 2015-11-22 NOTE — Telephone Encounter (Signed)
appt scheduled for 11/25/15 at 9:30am w/ [facility]

## 2015-11-22 NOTE — Telephone Encounter (Signed)
Phone call from pt.  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  Reported Dr. Jimmy Footman saw this yesterday, and advised her to make an appt. With the surgeon.  Stated the [place] has been sticking around the aneurysms, and has had quite a bit of bleeding with treatment.  Stated due to the enlarging size, she would like to make appt. ASAP;requested appt. On M-W-F, due to dialysis schedule.  Appt. given for 9:30 AM on 11/25/15.  Pt. agreed.

## 2015-11-25 ENCOUNTER — Other Ambulatory Visit: Payer: Self-pay

## 2015-11-25 ENCOUNTER — Encounter: Payer: Self-pay | Admitting: Vascular Surgery

## 2015-11-25 ENCOUNTER — Ambulatory Visit (INDEPENDENT_AMBULATORY_CARE_PROVIDER_SITE_OTHER): Payer: Medicare Other | Admitting: Vascular Surgery

## 2015-11-25 VITALS — BP 99/70 | HR 70 | Temp 99.2°F | Resp 16 | Ht 60.0 in | Wt 180.0 lb

## 2015-11-25 DIAGNOSIS — T82510D Breakdown (mechanical) of surgically created arteriovenous fistula, subsequent encounter: Secondary | ICD-10-CM

## 2015-11-25 DIAGNOSIS — T82898D Other specified complication of vascular prosthetic devices, implants and grafts, subsequent encounter: Secondary | ICD-10-CM

## 2015-11-25 DIAGNOSIS — T82510A Breakdown (mechanical) of surgically created arteriovenous fistula, initial encounter: Secondary | ICD-10-CM | POA: Insufficient documentation

## 2015-11-25 DIAGNOSIS — T82898A Other specified complication of vascular prosthetic devices, implants and grafts, initial encounter: Secondary | ICD-10-CM | POA: Insufficient documentation

## 2015-11-25 NOTE — Progress Notes (Signed)
Referred by:  Nicole Bien, MD [address] [place], [place] [address]  Reason for referral: bleeding from L forearm fistula  History of Present Illness  Nicole Parrish is a 45 y.o. (December 20, 1970) female w/ L RC AVF who presents cc: bleeding from fistula.  The patient's L RC AVF was last revised in 2013 by Dr. Donnetta Parrish.  She has no problems with HD except for excessive bleeding from aneurysmal areas.  She notes some mild steal sx in the left hand but is able to complete her ADL.  She is RHD.  Past Medical History:  Diagnosis Date  . Anemia   . CHF (congestive heart failure) (HCC)    diastolic heart failure  . Chronic kidney disease    Tues, Th, Sat-[facility]-last done Tues  . Dialysis patient ([facility])   . Hypertension   . Mitral regurgitation    Moderate  . Secondary hyperparathyroidism (of renal origin)     Past Surgical History:  Procedure Laterality Date  . AV FISTULA PLACEMENT, RADIOCEPHALIC  53/ 6144   Left arm   By Dr. Kellie Parrish  . AV FISTULA REPAIR  06/03/10   Ligation of competing branches Left AVF By Dr. Bridgett Parrish  . CYSTECTOMY     left under arm  . SHUNTOGRAM Left 06/12/2011   Procedure: Earney Mallet;  Surgeon: Nicole Dutch, MD;  Location: [facility] CATH LAB;  Service: Cardiovascular;  Laterality: Left;    Social History   Social History  . Marital status: Single    Spouse name: N/A  . Number of children: N/A  . Years of education: N/A   Occupational History  . Not on file.   Social History Main Topics  . Smoking status: Never Smoker  . Smokeless tobacco: Never Used  . Alcohol use No  . Drug use: No  . Sexual activity: Not on file   Other Topics Concern  . Not on file   Social History Narrative  . No narrative on file    Family History: patient is unable to detail the medical history of his parents   Current Outpatient Prescriptions  Medication Sig Dispense Refill  . aspirin 81 MG tablet Take 81 mg by mouth daily.      Marland Kitchen b complex-vitamin c-folic  acid (NEPHRO-VITE) 0.8 MG TABS Take 0.8 mg by mouth every morning.     . calcium acetate (PHOSLO) 667 MG capsule Take 2,001 mg by mouth 3 (three) times daily.     . isosorbide mononitrate (IMDUR) 30 MG 24 hr tablet Take 30 mg by mouth daily.      . Lanthanum Carbonate (FOSRENOL) 1000 MG PACK Take 1,000 mg by mouth 3 (three) times daily with meals.    . lidocaine-prilocaine (EMLA) cream Apply 1 application topically as needed.     . metoprolol succinate (TOPROL-XL) 100 MG 24 hr tablet Take 100 mg by mouth at bedtime. Take with or immediately following a meal.     No current facility-administered medications for this visit.     No Known Allergies  REVIEW OF SYSTEMS:   Cardiac:  positive for: no symptoms, negative for: Chest pain or chest pressure, Shortness of breath upon exertion and Shortness of breath when lying flat,   Vascular:  positive for: no symptoms,  negative for: Pain in calf, thigh, or hip brought on by ambulation, Pain in feet at night that wakes you up from your sleep, Blood clot in your veins and Leg swelling  Pulmonary:  positive for: no symptoms,  negative for: Oxygen at home, Productive cough and Wheezing  Neurologic:  positive for: Sudden numbness in arms or legs, negative for: Sudden weakness in arms or legs, Sudden onset of difficulty speaking or slurred speech, Temporary loss of vision in one eye and Problems with dizziness  Gastrointestinal:  positive for: no symptoms, negative for: Blood in stool and Vomited blood  Genitourinary:  positive for: no symptoms, negative for: Burning when urinating and Blood in urine  Psychiatric:  positive for: Major depression,  negative for: other psychiatric dx  Hematologic:  positive for: Bleeding problems,  negative for: negative for: Problems with blood clotting too easily  Dermatologic:  positive for: no symptoms, negative for: Rashes or ulcers  Constitutional:  positive for: no symptoms, negative for:  Fever or chills  Ear/Nose/Throat:  positive for: no symptoms, negative for: Change in hearing, Nose bleeds and Sore throat  Musculoskeletal:  positive for: no symptoms, negative for: Back pain, Joint pain and Muscle pain   Physical Examination  Vitals:   11/25/15 0934  BP: 99/70  Pulse: 70  Resp: 16  Temp: 99.2 F (37.3 C)  TempSrc: Oral  SpO2: 100%  Weight: 180 lb (81.6 kg)  Height: 5' (1.524 m)    Body mass index is 35.15 kg/m.  General: Alert, O x 3, WD,NAD  Head: [place]/AT,   Ear/Nose/Throat: Hearing grossly intact, nares without erythema or drainage, oropharynx without Erythema or Exudate , Mallampati score: 3, Dentition intact  Eyes: PERRLA, EOMI,   Neck: Supple, mid-line trachea,    Pulmonary: Sym exp, good B air movt,CTA B  Cardiac: RRR, Nl S1, S2, no Murmurs, No rubs, No S3,S4  Vascular: Vessel Right Left  Radial Palpable Palpable  Brachial Palpable Palpable  Carotid Palpable, No Bruit Palpable, No Bruit  Aorta Not palpable N/A   Gastrointestinal: soft, non-distended, non-tender to palpation, No guarding or rebound, no HSM, no masses, no CVAT B, No palpable prominent aortic pulse,    Musculoskeletal: M/S 5/5 throughout  , Extremities without ischemic changes  , No edema present,  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   Medical Decision Making  Nicole SCHLAUCH is a 45 y.o. female who presents with ESRD requiring hemodialysis, L RC AVF with two mod-large PSA with recurrent bleeding, mild steal  sx  Based on her exam and history, I offered the patient: plication of left radiocephalic arteriovenous fistula pseudoaneurysms. Risk, benefits, and alternatives to access surgery were discussed.   The patient is aware the risks include but are not limited to: bleeding, infection, steal syndrome, nerve damage, ischemic monomelic neuropathy, failure to mature, need for additional procedures, death and stroke.   I also discussed the risk of recurrent bleeding due to the vein wall being weakened already and possible worsening of her steal sx by optimizing the flow in the fistula with the plications.  The patient has agreed to proceed with the above procedure which will be scheduled 18 OCT 17.   Adele Barthel, MD, FACS [facility] Office: [phone] Pager: [phone]  11/25/2015, 10:07  AM    

## 2015-11-28 ENCOUNTER — Encounter: Payer: Self-pay | Admitting: Nephrology

## 2015-12-03 ENCOUNTER — Encounter (HOSPITAL_COMMUNITY): Payer: Self-pay | Admitting: *Deleted

## 2015-12-03 NOTE — Progress Notes (Signed)
Spoke with pt for pre-op call. Pt denies cardiac history, chest pain or sob. 

## 2015-12-04 ENCOUNTER — Encounter (HOSPITAL_COMMUNITY)
Admission: RE | Disposition: A | Discharge status: Home / Self Care | Payer: Self-pay | Source: Ambulatory Visit | Attending: Vascular Surgery

## 2015-12-04 ENCOUNTER — Ambulatory Visit (HOSPITAL_COMMUNITY)
Admission: RE | Admit: 2015-12-04 | Discharge: 2015-12-04 | Disposition: A | Discharge status: Home / Self Care | Payer: Medicare Other | Source: Ambulatory Visit | Attending: Vascular Surgery | Admitting: Vascular Surgery

## 2015-12-04 ENCOUNTER — Ambulatory Visit (HOSPITAL_COMMUNITY): Payer: Medicare Other | Admitting: Certified Registered"

## 2015-12-04 ENCOUNTER — Encounter (HOSPITAL_COMMUNITY): Payer: Self-pay | Admitting: Certified Registered"

## 2015-12-04 DIAGNOSIS — Z7982 Long term (current) use of aspirin: Secondary | ICD-10-CM | POA: Insufficient documentation

## 2015-12-04 DIAGNOSIS — Y832 Surgical operation with anastomosis, bypass or graft as the cause of abnormal reaction of the patient, or of later complication, without mention of misadventure at the time of the procedure: Secondary | ICD-10-CM | POA: Diagnosis not present

## 2015-12-04 DIAGNOSIS — T82898A Other specified complication of vascular prosthetic devices, implants and grafts, initial encounter: Secondary | ICD-10-CM | POA: Insufficient documentation

## 2015-12-04 DIAGNOSIS — I4891 Unspecified atrial fibrillation: Secondary | ICD-10-CM | POA: Insufficient documentation

## 2015-12-04 DIAGNOSIS — Z992 Dependence on renal dialysis: Secondary | ICD-10-CM | POA: Insufficient documentation

## 2015-12-04 DIAGNOSIS — I130 Hypertensive heart and chronic kidney disease with heart failure and stage 1 through stage 4 chronic kidney disease, or unspecified chronic kidney disease: Secondary | ICD-10-CM | POA: Insufficient documentation

## 2015-12-04 DIAGNOSIS — N186 End stage renal disease: Secondary | ICD-10-CM | POA: Insufficient documentation

## 2015-12-04 DIAGNOSIS — N2581 Secondary hyperparathyroidism of renal origin: Secondary | ICD-10-CM | POA: Diagnosis not present

## 2015-12-04 DIAGNOSIS — I5032 Chronic diastolic (congestive) heart failure: Secondary | ICD-10-CM | POA: Insufficient documentation

## 2015-12-04 DIAGNOSIS — Z79899 Other long term (current) drug therapy: Secondary | ICD-10-CM | POA: Insufficient documentation

## 2015-12-04 HISTORY — DX: Family history of other specified conditions: Z84.89

## 2015-12-04 HISTORY — DX: Constipation, unspecified: K59.00

## 2015-12-04 HISTORY — DX: Major depressive disorder, single episode, unspecified: F32.9

## 2015-12-04 HISTORY — PX: REVISON OF ARTERIOVENOUS FISTULA: SHX6074

## 2015-12-04 HISTORY — DX: Depression, unspecified: F32.A

## 2015-12-04 LAB — HCG, SERUM, QUALITATIVE: PREG SERUM: NEGATIVE

## 2015-12-04 LAB — POCT I-STAT 4, (NA,K, GLUC, HGB,HCT)
Glucose, Bld: 83 mg/dL (ref 65–99)
HEMATOCRIT: 41 % (ref 36.0–46.0)
HEMOGLOBIN: 13.9 g/dL (ref 12.0–15.0)
Potassium: 3.8 mmol/L (ref 3.5–5.1)
SODIUM: 139 mmol/L (ref 135–145)

## 2015-12-04 SURGERY — REVISON OF ARTERIOVENOUS FISTULA
Anesthesia: General | Site: Arm Lower | Laterality: Left

## 2015-12-04 MED ORDER — PROPOFOL 10 MG/ML IV BOLUS
INTRAVENOUS | Status: DC | PRN
  Administered 2015-12-04: 20 mg via INTRAVENOUS
  Administered 2015-12-04: 150 mg via INTRAVENOUS

## 2015-12-04 MED ORDER — PHENYLEPHRINE HCL 10 MG/ML IJ SOLN
INTRAMUSCULAR | Status: DC | PRN
  Administered 2015-12-04: 160 ug via INTRAVENOUS
  Administered 2015-12-04: 80 ug via INTRAVENOUS
  Administered 2015-12-04: 160 ug via INTRAVENOUS

## 2015-12-04 MED ORDER — EPHEDRINE SULFATE 50 MG/ML IJ SOLN
INTRAMUSCULAR | Status: DC | PRN
  Administered 2015-12-04: 15 mg via INTRAVENOUS
  Administered 2015-12-04 (×2): 10 mg via INTRAVENOUS

## 2015-12-04 MED ORDER — ONDANSETRON HCL 4 MG/2ML IJ SOLN
INTRAMUSCULAR | Status: AC
  Filled 2015-12-04: qty 2

## 2015-12-04 MED ORDER — SODIUM CHLORIDE 0.9 % IV SOLN
INTRAVENOUS | Status: DC
  Administered 2015-12-04 (×2): via INTRAVENOUS

## 2015-12-04 MED ORDER — HEPARIN SODIUM (PORCINE) 1000 UNIT/ML IJ SOLN
INTRAMUSCULAR | Status: DC | PRN
  Administered 2015-12-04: 8000 [IU] via INTRAVENOUS

## 2015-12-04 MED ORDER — HEMOSTATIC AGENTS (NO CHARGE) OPTIME
TOPICAL | Status: DC | PRN
  Administered 2015-12-04: 1 via TOPICAL

## 2015-12-04 MED ORDER — LIDOCAINE-EPINEPHRINE (PF) 1 %-1:200000 IJ SOLN
INTRAMUSCULAR | Status: AC
  Filled 2015-12-04: qty 30

## 2015-12-04 MED ORDER — SODIUM CHLORIDE 0.9 % IV SOLN
INTRAVENOUS | Status: DC | PRN
  Administered 2015-12-04: 11:00:00

## 2015-12-04 MED ORDER — MIDAZOLAM HCL 2 MG/2ML IJ SOLN
INTRAMUSCULAR | Status: AC
  Filled 2015-12-04: qty 2

## 2015-12-04 MED ORDER — LIDOCAINE HCL (CARDIAC) 20 MG/ML IV SOLN
INTRAVENOUS | Status: DC | PRN
  Administered 2015-12-04: 50 mg via INTRAVENOUS

## 2015-12-04 MED ORDER — ONDANSETRON HCL 4 MG/2ML IJ SOLN
INTRAMUSCULAR | Status: DC | PRN
  Administered 2015-12-04: 4 mg via INTRAVENOUS

## 2015-12-04 MED ORDER — LIDOCAINE-EPINEPHRINE (PF) 1 %-1:200000 IJ SOLN
INTRAMUSCULAR | Status: DC | PRN
  Administered 2015-12-04: 30 mL

## 2015-12-04 MED ORDER — PROTAMINE SULFATE 10 MG/ML IV SOLN
INTRAVENOUS | Status: DC | PRN
  Administered 2015-12-04: 50 mg via INTRAVENOUS

## 2015-12-04 MED ORDER — EPHEDRINE 5 MG/ML INJ
INTRAVENOUS | Status: AC
  Filled 2015-12-04: qty 10

## 2015-12-04 MED ORDER — DEXTROSE 5 % IV SOLN
1.5000 g | INTRAVENOUS | Status: AC
  Administered 2015-12-04: 1.5 g via INTRAVENOUS

## 2015-12-04 MED ORDER — PHENYLEPHRINE HCL 10 MG/ML IJ SOLN
INTRAVENOUS | Status: DC | PRN
  Administered 2015-12-04: 50 ug/min via INTRAVENOUS

## 2015-12-04 MED ORDER — LIDOCAINE 2% (20 MG/ML) 5 ML SYRINGE
INTRAMUSCULAR | Status: AC
  Filled 2015-12-04: qty 5

## 2015-12-04 MED ORDER — FENTANYL CITRATE (PF) 100 MCG/2ML IJ SOLN
INTRAMUSCULAR | Status: DC | PRN
  Administered 2015-12-04 (×2): 50 ug via INTRAVENOUS

## 2015-12-04 MED ORDER — 0.9 % SODIUM CHLORIDE (POUR BTL) OPTIME
TOPICAL | Status: DC | PRN
  Administered 2015-12-04: 1000 mL

## 2015-12-04 MED ORDER — FENTANYL CITRATE (PF) 100 MCG/2ML IJ SOLN
INTRAMUSCULAR | Status: AC
  Filled 2015-12-04: qty 2

## 2015-12-04 MED ORDER — PROPOFOL 10 MG/ML IV BOLUS
INTRAVENOUS | Status: AC
  Filled 2015-12-04: qty 20

## 2015-12-04 MED ORDER — HEPARIN SODIUM (PORCINE) 1000 UNIT/ML IJ SOLN
INTRAMUSCULAR | Status: AC
  Filled 2015-12-04: qty 1

## 2015-12-04 MED ORDER — CHLORHEXIDINE GLUCONATE CLOTH 2 % EX PADS: 6.0000 | MEDICATED_PAD | Freq: Once | CUTANEOUS | Status: DC

## 2015-12-04 MED ORDER — PHENYLEPHRINE 40 MCG/ML (10ML) SYRINGE FOR IV PUSH (FOR BLOOD PRESSURE SUPPORT)
PREFILLED_SYRINGE | INTRAVENOUS | Status: AC
  Filled 2015-12-04: qty 10

## 2015-12-04 MED ORDER — OXYCODONE-ACETAMINOPHEN 5-325 MG PO TABS: 1.0000 | ORAL_TABLET | Freq: Four times a day (QID) | ORAL | 0 refills | Status: DC | PRN

## 2015-12-04 MED ORDER — DEXTROSE 5 % IV SOLN
INTRAVENOUS | Status: AC
  Filled 2015-12-04: qty 1.5

## 2015-12-04 MED ORDER — PROTAMINE SULFATE 10 MG/ML IV SOLN
INTRAVENOUS | Status: AC
  Filled 2015-12-04: qty 5

## 2015-12-04 SURGICAL SUPPLY — 42 items
ADH SKN CLS APL DERMABOND .7 (GAUZE/BANDAGES/DRESSINGS)
AGENT HMST SPONGE THK3/8 (HEMOSTASIS) ×1
ARMBAND PINK RESTRICT EXTREMIT (MISCELLANEOUS) ×3 IMPLANT
CANISTER SUCTION 2500CC (MISCELLANEOUS) ×3 IMPLANT
CLIP TI MEDIUM 6 (CLIP) ×3 IMPLANT
CLIP TI WIDE RED SMALL 6 (CLIP) ×3 IMPLANT
COVER PROBE W GEL 5X96 (DRAPES) IMPLANT
CUFF TOURNIQUET SINGLE 18IN (TOURNIQUET CUFF) ×2 IMPLANT
DECANTER SPIKE VIAL GLASS SM (MISCELLANEOUS) ×3 IMPLANT
DERMABOND ADVANCED (GAUZE/BANDAGES/DRESSINGS)
DERMABOND ADVANCED .7 DNX12 (GAUZE/BANDAGES/DRESSINGS) ×1 IMPLANT
DRAIN PENROSE 1/2X12 LTX STRL (WOUND CARE) IMPLANT
ELECT REM PT RETURN 9FT ADLT (ELECTROSURGICAL) ×3
ELECTRODE REM PT RTRN 9FT ADLT (ELECTROSURGICAL) ×1 IMPLANT
GAUZE SPONGE 4X4 12PLY STRL (GAUZE/BANDAGES/DRESSINGS) ×2 IMPLANT
GLOVE BIO SURGEON STRL SZ7 (GLOVE) ×3 IMPLANT
GLOVE BIOGEL PI IND STRL 6.5 (GLOVE) IMPLANT
GLOVE BIOGEL PI IND STRL 7.0 (GLOVE) ×2 IMPLANT
GLOVE BIOGEL PI IND STRL 7.5 (GLOVE) ×1 IMPLANT
GLOVE BIOGEL PI INDICATOR 6.5 (GLOVE) ×2
GLOVE BIOGEL PI INDICATOR 7.0 (GLOVE) ×4
GLOVE BIOGEL PI INDICATOR 7.5 (GLOVE) ×2
GLOVE ECLIPSE 6.5 STRL STRAW (GLOVE) ×3 IMPLANT
GOWN STRL REUS W/ TWL LRG LVL3 (GOWN DISPOSABLE) ×3 IMPLANT
GOWN STRL REUS W/TWL LRG LVL3 (GOWN DISPOSABLE) ×9
HEMOSTAT SPONGE AVITENE ULTRA (HEMOSTASIS) ×2 IMPLANT
KIT BASIN OR (CUSTOM PROCEDURE TRAY) ×3 IMPLANT
KIT ROOM TURNOVER OR (KITS) ×3 IMPLANT
NS IRRIG 1000ML POUR BTL (IV SOLUTION) ×3 IMPLANT
PACK CV ACCESS (CUSTOM PROCEDURE TRAY) ×3 IMPLANT
PAD ARMBOARD 7.5X6 YLW CONV (MISCELLANEOUS) ×6 IMPLANT
STAPLER VISISTAT 35W (STAPLE) ×2 IMPLANT
SUT MNCRL AB 4-0 PS2 18 (SUTURE) ×3 IMPLANT
SUT PROLENE 5 0 C 1 24 (SUTURE) ×9 IMPLANT
SUT PROLENE 6 0 BV (SUTURE) ×2 IMPLANT
SUT PROLENE 7 0 BV 1 (SUTURE) ×1 IMPLANT
SUT VIC AB 3-0 MH 27 (SUTURE) IMPLANT
SUT VIC AB 3-0 SH 27 (SUTURE) ×6
SUT VIC AB 3-0 SH 27X BRD (SUTURE) ×1 IMPLANT
TAPE CLOTH SURG 4X10 WHT LF (GAUZE/BANDAGES/DRESSINGS) ×3 IMPLANT
UNDERPAD 30X30 (UNDERPADS AND DIAPERS) ×3 IMPLANT
WATER STERILE IRR 1000ML POUR (IV SOLUTION) ×3 IMPLANT

## 2015-12-04 NOTE — Transfer of Care (Signed)
Immediate Anesthesia Transfer of Care Note  Patient: Veto Kemps  Procedure(s) Performed: Procedure(s): PLICATION OF LEFT RADIOCEPHALIC ARTERIOVENOUS FISTULA PSEUDOANEURYSM (Left)  Patient Location: PACU  Anesthesia Type:General  Level of Consciousness: lethargic and responds to stimulation  Airway & Oxygen Therapy: Patient Spontanous Breathing and Patient connected to nasal cannula oxygen  Post-op Assessment: Report given to RN  Post vital signs: Reviewed and stable  Last Vitals:  Vitals:   12/04/15 0910  BP: (!) 95/59  Pulse: 61  Resp: 20  Temp: 36.8 C    Last Pain:  Vitals:   12/04/15 0910  TempSrc: Oral  PainSc:       Patients Stated Pain Goal: 3 (12/75/17 0017)  Complications: No apparent anesthesia complications

## 2015-12-04 NOTE — H&P (View-Only) (Signed)
Referred by:  Fanny Bien, MD [address] [place], [place] [address]  Reason for referral: bleeding from L forearm fistula  History of Present Illness  Nicole Parrish is a 45 y.o. (07-05-1970) female w/ L RC AVF who presents cc: bleeding from fistula.  The patient's L RC AVF was last revised in 2013 by Dr. Donnetta Hutching.  She has no problems with HD except for excessive bleeding from aneurysmal areas.  She notes some mild steal sx in the left hand but is able to complete her ADL.  She is RHD.  Past Medical History:  Diagnosis Date  . Anemia   . CHF (congestive heart failure) (HCC)    diastolic heart failure  . Chronic kidney disease    Tues, Th, Sat-[facility]-last done Tues  . Dialysis patient ([facility])   . Hypertension   . Mitral regurgitation    Moderate  . Secondary hyperparathyroidism (of renal origin)     Past Surgical History:  Procedure Laterality Date  . AV FISTULA PLACEMENT, RADIOCEPHALIC  58/ 5277   Left arm   By Dr. Kellie Simmering  . AV FISTULA REPAIR  06/03/10   Ligation of competing branches Left AVF By Dr. Bridgett Larsson  . CYSTECTOMY     left under arm  . SHUNTOGRAM Left 06/12/2011   Procedure: Earney Mallet;  Surgeon: Elam Dutch, MD;  Location: [facility] CATH LAB;  Service: Cardiovascular;  Laterality: Left;    Social History   Social History  . Marital status: Single    Spouse name: N/A  . Number of children: N/A  . Years of education: N/A   Occupational History  . Not on file.   Social History Main Topics  . Smoking status: Never Smoker  . Smokeless tobacco: Never Used  . Alcohol use No  . Drug use: No  . Sexual activity: Not on file   Other Topics Concern  . Not on file   Social History Narrative  . No narrative on file    Family History: patient is unable to detail the medical history of his parents   Current Outpatient Prescriptions  Medication Sig Dispense Refill  . aspirin 81 MG tablet Take 81 mg by mouth daily.      Marland Kitchen b complex-vitamin c-folic  acid (NEPHRO-VITE) 0.8 MG TABS Take 0.8 mg by mouth every morning.     . calcium acetate (PHOSLO) 667 MG capsule Take 2,001 mg by mouth 3 (three) times daily.     . isosorbide mononitrate (IMDUR) 30 MG 24 hr tablet Take 30 mg by mouth daily.      . Lanthanum Carbonate (FOSRENOL) 1000 MG PACK Take 1,000 mg by mouth 3 (three) times daily with meals.    . lidocaine-prilocaine (EMLA) cream Apply 1 application topically as needed.     . metoprolol succinate (TOPROL-XL) 100 MG 24 hr tablet Take 100 mg by mouth at bedtime. Take with or immediately following a meal.     No current facility-administered medications for this visit.     No Known Allergies  REVIEW OF SYSTEMS:   Cardiac:  positive for: no symptoms, negative for: Chest pain or chest pressure, Shortness of breath upon exertion and Shortness of breath when lying flat,   Vascular:  positive for: no symptoms,  negative for: Pain in calf, thigh, or hip brought on by ambulation, Pain in feet at night that wakes you up from your sleep, Blood clot in your veins and Leg swelling  Pulmonary:  positive for: no symptoms,  negative for: Oxygen at home, Productive cough and Wheezing  Neurologic:  positive for: Sudden numbness in arms or legs, negative for: Sudden weakness in arms or legs, Sudden onset of difficulty speaking or slurred speech, Temporary loss of vision in one eye and Problems with dizziness  Gastrointestinal:  positive for: no symptoms, negative for: Blood in stool and Vomited blood  Genitourinary:  positive for: no symptoms, negative for: Burning when urinating and Blood in urine  Psychiatric:  positive for: Major depression,  negative for: other psychiatric dx  Hematologic:  positive for: Bleeding problems,  negative for: negative for: Problems with blood clotting too easily  Dermatologic:  positive for: no symptoms, negative for: Rashes or ulcers  Constitutional:  positive for: no symptoms, negative for:  Fever or chills  Ear/Nose/Throat:  positive for: no symptoms, negative for: Change in hearing, Nose bleeds and Sore throat  Musculoskeletal:  positive for: no symptoms, negative for: Back pain, Joint pain and Muscle pain   Physical Examination  Vitals:   11/25/15 0934  BP: 99/70  Pulse: 70  Resp: 16  Temp: 99.2 F (37.3 C)  TempSrc: Oral  SpO2: 100%  Weight: 180 lb (81.6 kg)  Height: 5' (1.524 m)    Body mass index is 35.15 kg/m.  General: Alert, O x 3, WD,NAD  Head: [place]/AT,   Ear/Nose/Throat: Hearing grossly intact, nares without erythema or drainage, oropharynx without Erythema or Exudate , Mallampati score: 3, Dentition intact  Eyes: PERRLA, EOMI,   Neck: Supple, mid-line trachea,    Pulmonary: Sym exp, good B air movt,CTA B  Cardiac: RRR, Nl S1, S2, no Murmurs, No rubs, No S3,S4  Vascular: Vessel Right Left  Radial Palpable Palpable  Brachial Palpable Palpable  Carotid Palpable, No Bruit Palpable, No Bruit  Aorta Not palpable N/A   Gastrointestinal: soft, non-distended, non-tender to palpation, No guarding or rebound, no HSM, no masses, no CVAT B, No palpable prominent aortic pulse,    Musculoskeletal: M/S 5/5 throughout  , Extremities without ischemic changes  , No edema present,  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   Medical Decision Making  JULIENE KIRSH is a 45 y.o. female who presents with ESRD requiring hemodialysis, L RC AVF with two mod-large PSA with recurrent bleeding, mild steal  sx  Based on her exam and history, I offered the patient: plication of left radiocephalic arteriovenous fistula pseudoaneurysms. Risk, benefits, and alternatives to access surgery were discussed.   The patient is aware the risks include but are not limited to: bleeding, infection, steal syndrome, nerve damage, ischemic monomelic neuropathy, failure to mature, need for additional procedures, death and stroke.   I also discussed the risk of recurrent bleeding due to the vein wall being weakened already and possible worsening of her steal sx by optimizing the flow in the fistula with the plications.  The patient has agreed to proceed with the above procedure which will be scheduled 18 OCT 17.   Adele Barthel, MD, FACS [facility] Office: [phone] Pager: [phone]  11/25/2015, 10:07  AM    

## 2015-12-04 NOTE — Anesthesia Postprocedure Evaluation (Signed)
Anesthesia Post Note  Patient: Veto Kemps  Procedure(s) Performed: Procedure(s) (LRB): PLICATION OF LEFT RADIOCEPHALIC ARTERIOVENOUS FISTULA PSEUDOANEURYSM (Left)  Patient location during evaluation: PACU Anesthesia Type: General Level of consciousness: awake and alert Pain management: pain level controlled Vital Signs Assessment: post-procedure vital signs reviewed and stable Respiratory status: spontaneous breathing, nonlabored ventilation, respiratory function stable and patient connected to nasal cannula oxygen Cardiovascular status: blood pressure returned to baseline and stable Postop Assessment: no signs of nausea or vomiting Anesthetic complications: no    Last Vitals:  Vitals:   12/04/15 1245 12/04/15 1300  BP: 101/67 113/67  Pulse: 76 91  Resp: 10 17  Temp:      Last Pain:  Vitals:   12/04/15 1206  TempSrc:   PainSc: 0-No pain                 Reginal Lutes

## 2015-12-04 NOTE — Anesthesia Procedure Notes (Signed)
Procedure Name: LMA Insertion Date/Time: 12/04/2015 10:35 AM Performed by: Sampson Si E Pre-anesthesia Checklist: Patient identified, Emergency Drugs available, Suction available and Patient being monitored Patient Re-evaluated:Patient Re-evaluated prior to inductionOxygen Delivery Method: Circle System Utilized Preoxygenation: Pre-oxygenation with 100% oxygen Intubation Type: IV induction Ventilation: Mask ventilation without difficulty LMA: LMA inserted LMA Size: 4.0 Number of attempts: 1 Placement Confirmation: positive ETCO2 and breath sounds checked- equal and bilateral Tube secured with: Tape Dental Injury: Teeth and Oropharynx as per pre-operative assessment

## 2015-12-04 NOTE — Interval H&P Note (Signed)
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 After consideration of risks, benefits and other options for treatment, the patient has consented to  Procedure(s): PLICATION OF RADIOCEPHALIC ARTERIOVENOUS FISTULA PSEUDOANEURYSM (Left) as a surgical intervention .  The patient's history has been reviewed, patient examined, no change in status, stable for surgery.  I have reviewed the patient's chart and labs.  Questions were answered to the patient's satisfaction.     Adele Barthel

## 2015-12-04 NOTE — Anesthesia Preprocedure Evaluation (Addendum)
Anesthesia Evaluation  Patient identified by MRN, date of birth, ID band Patient awake    Reviewed: Allergy & Precautions, NPO status , Patient's Chart, lab work & pertinent test results  Airway Mallampati: II  TM Distance: >3 FB Neck ROM: Full    Dental no notable dental hx.    Pulmonary neg pulmonary ROS,    Pulmonary exam normal        Cardiovascular hypertension, Pt. on medications negative cardio ROS Normal cardiovascular examAtrial Fibrillation   Left ventricle: The cavity size was normal. Wall thickness was increased in a pattern of moderate LVH. Systolic function was normal. The estimated ejection fraction was in the range of 60% to 65%. Wall motion was normal; there were no regional wall motion abnormalities. Doppler parameters are consistent with abnormal left ventricular relaxation (grade 1 diastolic dysfunction).   Neuro/Psych PSYCHIATRIC DISORDERS Depression negative neurological ROS  negative psych ROS   GI/Hepatic negative GI ROS, Neg liver ROS,   Endo/Other  negative endocrine ROS  Renal/GU ESRFRenal disease  negative genitourinary   Musculoskeletal negative musculoskeletal ROS (+)   Abdominal   Peds negative pediatric ROS (+)  Hematology negative hematology ROS (+)   Anesthesia Other Findings   Reproductive/Obstetrics negative OB ROS                            Anesthesia Physical Anesthesia Plan  ASA: III  Anesthesia Plan: General   Post-op Pain Management:    Induction: Intravenous  Airway Management Planned: LMA  Additional Equipment:   Intra-op Plan:   Post-operative Plan: Extubation in OR  Informed Consent: I have reviewed the patients History and Physical, chart, labs and discussed the procedure including the risks, benefits and alternatives for the proposed anesthesia with the patient or authorized representative who has indicated his/her  understanding and acceptance.   Dental advisory given  Plan Discussed with: CRNA and Surgeon  Anesthesia Plan Comments:        Anesthesia Quick Evaluation

## 2015-12-04 NOTE — Op Note (Signed)
    OPERATIVE NOTE   PROCEDURE: Plication of left radiocephalic arteriovenous fistula pseudoaneurysms x 2  PRE-OPERATIVE DIAGNOSIS: moderate to large bleeding left radiocephalic arteriovenous fistula pseudoaneurysms  POST-OPERATIVE DIAGNOSIS: same as above   SURGEON: Adele Barthel, MD  ASSISTANT(S): RNFA  ANESTHESIA: general and MAC  ESTIMATED BLOOD LOSS: 50 cc  FINDING(S): 1.  Large pseudoaneurysms without any thrombus 2.  Fistula >6 mm throughout 3.  Palpable thrill at end of the case  SPECIMEN(S):  none  INDICATIONS:   Nicole Parrish is a 45 y.o. female who presents with bleeding complications from her left radiocephalic arteriovenous fistula pseudoaneurysms.  Risk, benefits, and alternatives to access surgery were discussed.  The patient is aware the risks include but are not limited to: bleeding, infection, steal syndrome, nerve damage, ischemic monomelic neuropathy, failure to mature, need for additional procedures, death and stroke.  The patient agrees to proceed forward with the procedure.  DESCRIPTION: After obtaining full informed written consent, the patient was brought back to the operating room and placed supine upon the operating table.  The patient received IV antibiotics prior to induction.  After obtaining adequate anesthesia, the patient was prepped and draped in the standard fashion for: left arm access procedure.  I gave the patient 8000 units of Heparin which was a therapeutic bolus.  I marked out elliptical incisions overlying each of the two pseudoaneurysms.  After waiting three minutes, I applied a sterile tourniquet on the upper arm and exsanguinate the left arm with a tourniquet.  I inflated the tourniquet to 250 mm Hg.  I made two elliptical incisions as previously marked.  The close proximity of the two pseudoaneurysms essentially converted these two sets of incisions into one incision.  I dissected out the anterior wall of each of the pseudoaneurysms.  I made  an incision in the anterior wall of each pseudoaneurysm and sharply dissected down until I could identify the lumen of the fistula.  I sharply tailored the anterior pseudoaneurysmal wall of each pseudoaneurys, to leave a residual fistula lumen >6 mm.  I repaired each pseudoaneurysm by reapproximating the residual fistula wall, which I felt was adequate in both pseudoaneurysms, with a running stitch of 5-0 Prolene.  Prior to completing this repair, I deairred the fistula by compressing the fistula proximally and dropping the tourniquet, leading to drainage of air.  I completed the repair in the usual fashion.  I packed the combined incisions were Avitene and reversed anticoagulation with 50 mg of Protamine.  After waiting a few minutes, there continued to be bleeding from the skin edges.  I injected 10 cc of 1% lidocaine with epinephrine into the skin edge of the incision.  I controlled a few skin bleeders with electrocautery.  I repacked the incision with Avitene.  After a few minutes, there was no further active bleeding.  I reapproximated the subcutaneous tissue with a running stitch of 3-0 Vicryl.  The skin was then reapproximated with staples.  The skin was cleaned, dried, and dressed with sterile 4 x 4 gauze.  I marked the segment of fistula untouched by the surgery on the forearm.   COMPLICATIONS: noen  CONDITION: stable   Adele Barthel, MD [facility] [place] Office: [phone] Pager: [phone]  12/04/2015, 11:58 AM

## 2015-12-05 ENCOUNTER — Telehealth: Payer: Self-pay | Admitting: Vascular Surgery

## 2015-12-05 ENCOUNTER — Encounter (HOSPITAL_COMMUNITY): Payer: Self-pay | Admitting: Vascular Surgery

## 2015-12-05 NOTE — Telephone Encounter (Signed)
LVM on home # mailed letter or appt on 11/18

## 2015-12-05 NOTE — Telephone Encounter (Signed)
-----   Message from Mena Goes, RN sent at 12/04/2015 12:23 PM EDT ----- Regarding: 4 weeks    ----- Message ----- From: Conrad [place], MD Sent: 12/04/2015  12:10 PM To: [facility] [address]  Nicole Parrish [id number] 28-Oct-1970    PROCEDURE: 1. Plication of left radiocephalic arteriovenous fistula pseudoaneurysms x 2   Asst: RNFA  Follow-up: 4 weeks

## 2016-01-02 ENCOUNTER — Encounter: Payer: Self-pay | Admitting: Vascular Surgery

## 2016-01-02 NOTE — Progress Notes (Signed)
    Postoperative Access Visit   History of Present Illness  Nicole Parrish is a 45 y.o. year old female who presents for postoperative follow-up for: plication of L RC AVF PSA x 2 (Date: 12/04/15).  The patient's wounds are healed.  The patient notes no steal symptoms.  The patient is able to complete their activities of daily living.  The patient's current symptoms are: none.  For VQI Use Only  PRE-ADM LIVING: Home  AMB STATUS: Ambulatory  Physical Examination Vitals:   01/03/16 0946  BP: (!) 86/58  Pulse: 88  Resp: 16  Temp: 98.1 F (36.7 C)    LUE: Incision is healed, skin feels warm, hand grip is 5/5, sensation in digits is intact, palpable thrill, bruit can be auscultated   Medical Decision Making  Nicole Parrish is a 45 y.o. year old female who presents s/p plication of L RC AVF PSA x 2.   The patient's access is ready for use.  Thank you for allowing [place] to participate in this patient's care.  Adele Barthel, MD, FACS [facility] [place] [facility] Office: [phone] Pager: [phone]

## 2016-01-03 ENCOUNTER — Encounter: Payer: Self-pay | Admitting: Vascular Surgery

## 2016-01-03 ENCOUNTER — Ambulatory Visit (INDEPENDENT_AMBULATORY_CARE_PROVIDER_SITE_OTHER): Payer: Medicare Other | Admitting: Vascular Surgery

## 2016-01-03 VITALS — BP 86/58 | HR 88 | Temp 98.1°F | Resp 16 | Ht 60.0 in | Wt 175.0 lb

## 2016-01-03 DIAGNOSIS — Z992 Dependence on renal dialysis: Secondary | ICD-10-CM

## 2016-01-03 DIAGNOSIS — N186 End stage renal disease: Secondary | ICD-10-CM

## 2019-01-25 ENCOUNTER — Other Ambulatory Visit: Payer: Self-pay

## 2019-01-25 DIAGNOSIS — Z20822 Contact with and (suspected) exposure to covid-19: Secondary | ICD-10-CM

## 2019-01-27 LAB — NOVEL CORONAVIRUS, NAA: SARS-CoV-2, NAA: NOT DETECTED

## 2019-09-29 ENCOUNTER — Other Ambulatory Visit: Payer: Self-pay

## 2019-09-29 ENCOUNTER — Ambulatory Visit (INDEPENDENT_AMBULATORY_CARE_PROVIDER_SITE_OTHER): Payer: Medicare Other | Admitting: Physician Assistant

## 2019-09-29 VITALS — BP 90/64 | HR 79 | Temp 98.0°F | Ht 60.0 in | Wt 193.2 lb

## 2019-09-29 DIAGNOSIS — T82898A Other specified complication of vascular prosthetic devices, implants and grafts, initial encounter: Secondary | ICD-10-CM

## 2019-09-29 DIAGNOSIS — T82510A Breakdown (mechanical) of surgically created arteriovenous fistula, initial encounter: Secondary | ICD-10-CM

## 2019-09-29 NOTE — Progress Notes (Signed)
  POST OPERATIVE DIALYSIS ACCESS OFFICE NOTE    CC:  F/u for dialysis access surgery  HPI:  This is a 49 y.o. 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  She denies bleeding episodes or hand pain or numbness. Denies fever or chills  Her fistula was created years ago and she underwent plication of left radiocephalic arteriovenous fistula pseudoaneurysms x 2  By Dr. Bridgett Larsson in 2017. She also underwent revision with a reanastomosed in the cephalic vein to the more proximal radial artery 2013.  Dialysis days:  TTS   Dialysis center:  [facility].  Allergies  Allergen Reactions  . No Known Allergies     Current Outpatient Medications  Medication Sig Dispense Refill  . aspirin 81 MG tablet Take 81 mg by mouth daily.      Marland Kitchen b complex-vitamin c-folic acid (NEPHRO-VITE) 0.8 MG TABS Take 1 tablet by mouth every morning.     . cinacalcet (SENSIPAR) 60 MG tablet Take 60 mg by mouth every other day.    . diphenhydrAMINE (BENADRYL) 25 mg capsule Take 25 mg by mouth daily as needed for allergies.    Marland Kitchen ibuprofen (ADVIL,MOTRIN) 200 MG tablet Take 400 mg by mouth every 6 (six) hours as needed for mild pain.    . isosorbide mononitrate (IMDUR) 30 MG 24 hr tablet Take 30 mg by mouth daily.      . Lanthanum Carbonate (FOSRENOL) 1000 MG PACK Take 1,000 mg by mouth 3 (three) times daily with meals.    . lidocaine-prilocaine (EMLA) cream Apply 1 application topically as needed (before dialysis).     . metoprolol succinate (TOPROL-XL) 50 MG 24 hr tablet Take 50 mg by mouth daily. Take with or immediately following a meal.    . oxyCODONE-acetaminophen (ROXICET) 5-325 MG tablet Take 1-2 tablets by mouth every 6 (six) hours as needed for moderate pain. 10 tablet 0  . sertraline (ZOLOFT) 50 MG tablet Take 50 mg by mouth daily.      No current facility-administered medications for this visit.     ROS:  See HPI  BP 90/64   Pulse 79    Temp 98 F (36.7 C) (Skin)   Ht 5' (1.524 m)   Wt 193 lb 3.2 oz (87.6 kg)   SpO2 96%   BMI 37.73 kg/m    Physical Exam:  General appearance:WD, WN female in NAD Cardiac: RRR Respiratory:nonlabored Extremities:  LUE: 2+ radial pulse. Motor and sensation intact. Aneurysmal dilatation x 2 areas of fistula. There appears to be a small area of repeated needle stick distal to these areas without active oozing.      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 Recommend revision. Will arrange on non-dialysis day in near future. Her questions are answered and she agrees with plan.  Barbie Banner, PA-C 09/29/2019 9:17 AM [facility] [phone]  Clinic MD:  Donzetta Matters

## 2019-10-04 ENCOUNTER — Encounter: Payer: Self-pay | Admitting: *Deleted

## 2019-10-09 ENCOUNTER — Telehealth: Payer: Self-pay | Admitting: *Deleted

## 2019-10-09 NOTE — Telephone Encounter (Signed)
Left VM for pt to call back. Surgery time has changed. Pt needs to come to [facility] at [facility] for her procedure.

## 2019-10-18 ENCOUNTER — Other Ambulatory Visit (HOSPITAL_COMMUNITY): Payer: Medicare Other

## 2019-10-30 ENCOUNTER — Other Ambulatory Visit (HOSPITAL_COMMUNITY): Payer: Self-pay

## 2019-10-30 ENCOUNTER — Other Ambulatory Visit (HOSPITAL_COMMUNITY)
Admission: RE | Admit: 2019-10-30 | Discharge: 2019-10-30 | Disposition: A | Discharge status: Home / Self Care | Payer: Medicare Other | Source: Ambulatory Visit | Attending: Surgery | Admitting: Surgery

## 2019-10-30 ENCOUNTER — Other Ambulatory Visit: Payer: Self-pay

## 2019-10-30 ENCOUNTER — Encounter (HOSPITAL_COMMUNITY): Payer: Self-pay | Admitting: Surgery

## 2019-10-30 DIAGNOSIS — Z01812 Encounter for preprocedural laboratory examination: Secondary | ICD-10-CM | POA: Insufficient documentation

## 2019-10-30 DIAGNOSIS — Z20822 Contact with and (suspected) exposure to covid-19: Secondary | ICD-10-CM | POA: Insufficient documentation

## 2019-10-30 LAB — SARS CORONAVIRUS 2 (TAT 6-24 HRS): SARS Coronavirus 2: NEGATIVE

## 2019-10-30 NOTE — Progress Notes (Signed)
Nicole Parrish denies chest pain or shortness of breath. Patient was tested for Covid today  and has been in quarantine since that time.

## 2019-10-31 NOTE — Progress Notes (Signed)
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 Wall thickness was increased in a pattern of moderate LVH. Systolic function was normal. The estimated ejection fraction was in the range of 60% to 65%. Wall motion was normal; there were no regional wall motion abnormalities. Doppler parameters are consistent with abnormal left ventricular relaxation (grade 1 diastolic dysfunction).  - Mitral valve: There was systolic anterior motion. LVOT gradient 3.17m/s with valsalva. Doppler: No significant regurgitation.  - Left atrium: The atrium was mildly dilated.  - Atrial septum: No defect or patent foramen ovale was identified.     Past Medical History:  Diagnosis Date  . Anemia   . CHF (congestive heart failure) (HCC)    diastolic heart failure  . Chronic kidney disease    Tues, Th, Sat-[address]-last done Tues  . Constipation   . Depression   . Dialysis patient ([place])   . Family history of adverse reaction to anesthesia    grandmother had trouble waking up.  Marland Kitchen Heart murmur   . Hypertension   . Mitral regurgitation    Moderate  . Secondary hyperparathyroidism (of renal origin)     Past Surgical History:  Procedure Laterality Date  .  AV FISTULA PLACEMENT, RADIOCEPHALIC  16/ 6063   Left arm   By Dr. Kellie Simmering  . AV FISTULA REPAIR  06/03/10   Ligation of competing branches Left AVF By Dr. Bridgett Larsson  . CYSTECTOMY     left under arm and another surgery on right arm  . REVISON OF ARTERIOVENOUS FISTULA Left 01/60/1093   Procedure: PLICATION OF LEFT RADIOCEPHALIC ARTERIOVENOUS FISTULA PSEUDOANEURYSM;  Surgeon: Conrad , MD;  [place];  Service: Vascular;  Laterality: Left;  . SHUNTOGRAM Left 06/12/2011   Procedure: Earney Mallet;  Surgeon: Elam Dutch, MD;  Location: [facility] CATH LAB;  Service: Cardiovascular;  Laterality: Left;    MEDICATIONS: No current facility-administered medications for this encounter.   Marland Kitchen acetaminophen (TYLENOL) 500 MG tablet  . aspirin 81 MG tablet  . b complex-vitamin c-folic acid (NEPHRO-VITE) 0.8 MG TABS  . diphenhydrAMINE (BENADRYL) 25 mg capsule  . ibuprofen (ADVIL,MOTRIN) 200 MG tablet  . isosorbide mononitrate (IMDUR) 30 MG 24 hr tablet  . lanthanum (FOSRENOL) 1000 MG chewable tablet  . lidocaine-prilocaine (EMLA) cream  . metoprolol succinate (TOPROL-XL) 50 MG 24 hr tablet  . polyethylene glycol (MIRALAX / GLYCOLAX) 17 g packet  . sertraline (ZOLOFT) 50 MG tablet    If labs/EKG acceptable day of surgery, I anticipate pt can proceed with surgery as scheduled.  Willeen Cass, PhD, FNP-BC [facility] Stay [facility]/Anesthesiology Phone: [phone] 10/31/2019 10:04 AM

## 2019-10-31 NOTE — Anesthesia Preprocedure Evaluation (Addendum)
Anesthesia Evaluation  Patient identified by MRN, date of birth, ID band Patient awake    Reviewed: Allergy & Precautions, NPO status , Patient's Chart, lab work & pertinent test results  Airway Mallampati: II  TM Distance: >3 FB Neck ROM: Full    Dental no notable dental hx.    Pulmonary neg pulmonary ROS,    Pulmonary exam normal breath sounds clear to auscultation       Cardiovascular hypertension, + Valvular Problems/Murmurs MR  Rhythm:Regular Rate:Normal + Systolic murmurs    Neuro/Psych negative neurological ROS  negative psych ROS   GI/Hepatic negative GI ROS, Neg liver ROS,   Endo/Other  negative endocrine ROS  Renal/GU DialysisRenal disease  negative genitourinary   Musculoskeletal negative musculoskeletal ROS (+)   Abdominal   Peds negative pediatric ROS (+)  Hematology negative hematology ROS (+)   Anesthesia Other Findings   Reproductive/Obstetrics negative OB ROS                            Anesthesia Physical Anesthesia Plan  ASA: III  Anesthesia Plan: MAC   Post-op Pain Management:    Induction: Intravenous  PONV Risk Score and Plan: 2 and Ondansetron, Dexamethasone and Treatment may vary due to age or medical condition  Airway Management Planned: Simple Face Mask  Additional Equipment:   Intra-op Plan:   Post-operative Plan:   Informed Consent: I have reviewed the patients History and Physical, chart, labs and discussed the procedure including the risks, benefits and alternatives for the proposed anesthesia with the patient or authorized representative who has indicated his/her understanding and acceptance.     Dental advisory given  Plan Discussed with: CRNA and Surgeon  Anesthesia Plan Comments: (See APP note by Durel Salts, FNP)       Anesthesia Quick Evaluation

## 2019-11-01 ENCOUNTER — Encounter (HOSPITAL_COMMUNITY): Payer: Self-pay | Admitting: Surgery

## 2019-11-01 ENCOUNTER — Ambulatory Visit (HOSPITAL_COMMUNITY): Payer: Medicare Other | Admitting: Emergency Medicine

## 2019-11-01 ENCOUNTER — Ambulatory Visit (HOSPITAL_COMMUNITY)
Admission: RE | Admit: 2019-11-01 | Discharge: 2019-11-01 | Disposition: A | Discharge status: Home / Self Care | Payer: Medicare Other | Attending: Surgery | Admitting: Surgery

## 2019-11-01 ENCOUNTER — Encounter (HOSPITAL_COMMUNITY)
Admission: RE | Disposition: A | Discharge status: Home / Self Care | Payer: Self-pay | Source: Home / Self Care | Attending: Surgery

## 2019-11-01 ENCOUNTER — Other Ambulatory Visit: Payer: Self-pay

## 2019-11-01 DIAGNOSIS — I132 Hypertensive heart and chronic kidney disease with heart failure and with stage 5 chronic kidney disease, or end stage renal disease: Secondary | ICD-10-CM | POA: Insufficient documentation

## 2019-11-01 DIAGNOSIS — F329 Major depressive disorder, single episode, unspecified: Secondary | ICD-10-CM | POA: Diagnosis not present

## 2019-11-01 DIAGNOSIS — N2581 Secondary hyperparathyroidism of renal origin: Secondary | ICD-10-CM | POA: Insufficient documentation

## 2019-11-01 DIAGNOSIS — Z992 Dependence on renal dialysis: Secondary | ICD-10-CM | POA: Diagnosis not present

## 2019-11-01 DIAGNOSIS — I5032 Chronic diastolic (congestive) heart failure: Secondary | ICD-10-CM | POA: Diagnosis not present

## 2019-11-01 DIAGNOSIS — N186 End stage renal disease: Secondary | ICD-10-CM | POA: Diagnosis not present

## 2019-11-01 DIAGNOSIS — I340 Nonrheumatic mitral (valve) insufficiency: Secondary | ICD-10-CM | POA: Insufficient documentation

## 2019-11-01 DIAGNOSIS — Y841 Kidney dialysis as the cause of abnormal reaction of the patient, or of later complication, without mention of misadventure at the time of the procedure: Secondary | ICD-10-CM | POA: Insufficient documentation

## 2019-11-01 DIAGNOSIS — Z7982 Long term (current) use of aspirin: Secondary | ICD-10-CM | POA: Diagnosis not present

## 2019-11-01 DIAGNOSIS — Z79899 Other long term (current) drug therapy: Secondary | ICD-10-CM | POA: Insufficient documentation

## 2019-11-01 DIAGNOSIS — T82510A Breakdown (mechanical) of surgically created arteriovenous fistula, initial encounter: Secondary | ICD-10-CM | POA: Insufficient documentation

## 2019-11-01 DIAGNOSIS — T82898A Other specified complication of vascular prosthetic devices, implants and grafts, initial encounter: Secondary | ICD-10-CM | POA: Diagnosis not present

## 2019-11-01 HISTORY — DX: Cardiac murmur, unspecified: R01.1

## 2019-11-01 HISTORY — PX: REVISON OF ARTERIOVENOUS FISTULA: SHX6074

## 2019-11-01 LAB — POCT I-STAT, CHEM 8
BUN: 19 mg/dL (ref 6–20)
Calcium, Ion: 0.89 mmol/L — CL (ref 1.15–1.40)
Chloride: 98 mmol/L (ref 98–111)
Creatinine, Ser: 6.2 mg/dL — ABNORMAL HIGH (ref 0.44–1.00)
Glucose, Bld: 80 mg/dL (ref 70–99)
HCT: 37 % (ref 36.0–46.0)
Hemoglobin: 12.6 g/dL (ref 12.0–15.0)
Potassium: 4.3 mmol/L (ref 3.5–5.1)
Sodium: 142 mmol/L (ref 135–145)
TCO2: 29 mmol/L (ref 22–32)

## 2019-11-01 LAB — HCG, SERUM, QUALITATIVE: Preg, Serum: NEGATIVE

## 2019-11-01 SURGERY — REVISON OF ARTERIOVENOUS FISTULA
Anesthesia: Monitor Anesthesia Care | Laterality: Left

## 2019-11-01 MED ORDER — HEPARIN SODIUM (PORCINE) 1000 UNIT/ML IJ SOLN
INTRAMUSCULAR | Status: DC | PRN
  Administered 2019-11-01: 5000 [IU] via INTRAVENOUS

## 2019-11-01 MED ORDER — PROPOFOL 10 MG/ML IV BOLUS
INTRAVENOUS | Status: AC
  Filled 2019-11-01: qty 20

## 2019-11-01 MED ORDER — FENTANYL CITRATE (PF) 100 MCG/2ML IJ SOLN
INTRAMUSCULAR | Status: DC | PRN
Start: 2019-11-01 — End: 2019-11-01
  Administered 2019-11-01: 25 ug via INTRAVENOUS
  Administered 2019-11-01: 50 ug via INTRAVENOUS
  Administered 2019-11-01: 25 ug via INTRAVENOUS
  Administered 2019-11-01: 50 ug via INTRAVENOUS

## 2019-11-01 MED ORDER — OXYCODONE-ACETAMINOPHEN 5-325 MG PO TABS
ORAL_TABLET | ORAL | Status: DC
Start: 2019-11-01 — End: 2019-11-01
  Filled 2019-11-01: qty 1

## 2019-11-01 MED ORDER — DEXAMETHASONE SODIUM PHOSPHATE 10 MG/ML IJ SOLN
INTRAMUSCULAR | Status: DC | PRN
  Administered 2019-11-01: 10 mg via INTRAVENOUS

## 2019-11-01 MED ORDER — CHLORHEXIDINE GLUCONATE 4 % EX LIQD: 60.0000 mL | Freq: Once | CUTANEOUS | Status: DC

## 2019-11-01 MED ORDER — CEFAZOLIN SODIUM-DEXTROSE 2-4 GM/100ML-% IV SOLN
2.0000 g | INTRAVENOUS | Status: AC
  Administered 2019-11-01: 2 g via INTRAVENOUS

## 2019-11-01 MED ORDER — SODIUM CHLORIDE 0.9 % IV SOLN: INTRAVENOUS | Status: DC

## 2019-11-01 MED ORDER — 0.9 % SODIUM CHLORIDE (POUR BTL) OPTIME
TOPICAL | Status: DC | PRN
  Administered 2019-11-01: 1000 mL

## 2019-11-01 MED ORDER — PHENYLEPHRINE 40 MCG/ML (10ML) SYRINGE FOR IV PUSH (FOR BLOOD PRESSURE SUPPORT)
PREFILLED_SYRINGE | INTRAVENOUS | Status: DC | PRN
  Administered 2019-11-01 (×2): 80 ug via INTRAVENOUS

## 2019-11-01 MED ORDER — LIDOCAINE 2% (20 MG/ML) 5 ML SYRINGE
INTRAMUSCULAR | Status: DC | PRN
  Administered 2019-11-01: 100 mg via INTRAVENOUS

## 2019-11-01 MED ORDER — PROPOFOL 10 MG/ML IV BOLUS
INTRAVENOUS | Status: DC | PRN
  Administered 2019-11-01: 150 mg via INTRAVENOUS

## 2019-11-01 MED ORDER — CHLORHEXIDINE GLUCONATE 0.12 % MT SOLN: 15.0000 mL | Freq: Once | OROMUCOSAL | Status: AC

## 2019-11-01 MED ORDER — OXYCODONE-ACETAMINOPHEN 5-325 MG PO TABS: 1.0000 | ORAL_TABLET | Freq: Four times a day (QID) | ORAL | 0 refills | Status: AC | PRN

## 2019-11-01 MED ORDER — LIDOCAINE 2% (20 MG/ML) 5 ML SYRINGE
INTRAMUSCULAR | Status: AC
  Filled 2019-11-01: qty 5

## 2019-11-01 MED ORDER — LIDOCAINE-EPINEPHRINE (PF) 1 %-1:200000 IJ SOLN
INTRAMUSCULAR | Status: AC
  Filled 2019-11-01: qty 30

## 2019-11-01 MED ORDER — CHLORHEXIDINE GLUCONATE 0.12 % MT SOLN
OROMUCOSAL | Status: AC
  Administered 2019-11-01: 15 mL via OROMUCOSAL
  Filled 2019-11-01: qty 15

## 2019-11-01 MED ORDER — ORAL CARE MOUTH RINSE: 15.0000 mL | Freq: Once | OROMUCOSAL | Status: AC

## 2019-11-01 MED ORDER — OXYCODONE-ACETAMINOPHEN 5-325 MG PO TABS
1.0000 | ORAL_TABLET | Freq: Once | ORAL | Status: AC
  Administered 2019-11-01: 1 via ORAL

## 2019-11-01 MED ORDER — PHENYLEPHRINE HCL-NACL 10-0.9 MG/250ML-% IV SOLN
INTRAVENOUS | Status: DC | PRN
  Administered 2019-11-01: 20 ug/min via INTRAVENOUS

## 2019-11-01 MED ORDER — LACTATED RINGERS IV SOLN: INTRAVENOUS | Status: DC

## 2019-11-01 MED ORDER — CEFAZOLIN SODIUM-DEXTROSE 2-4 GM/100ML-% IV SOLN
INTRAVENOUS | Status: AC
  Filled 2019-11-01: qty 100

## 2019-11-01 MED ORDER — MIDAZOLAM HCL 2 MG/2ML IJ SOLN
INTRAMUSCULAR | Status: AC
  Filled 2019-11-01: qty 2

## 2019-11-01 MED ORDER — MIDAZOLAM HCL 5 MG/5ML IJ SOLN
INTRAMUSCULAR | Status: DC | PRN
  Administered 2019-11-01: 2 mg via INTRAVENOUS

## 2019-11-01 MED ORDER — ONDANSETRON HCL 4 MG/2ML IJ SOLN
INTRAMUSCULAR | Status: DC | PRN
  Administered 2019-11-01: 4 mg via INTRAVENOUS

## 2019-11-01 MED ORDER — FENTANYL CITRATE (PF) 100 MCG/2ML IJ SOLN
25.0000 ug | INTRAMUSCULAR | Status: DC | PRN
  Administered 2019-11-01: 25 ug via INTRAVENOUS

## 2019-11-01 MED ORDER — FENTANYL CITRATE (PF) 100 MCG/2ML IJ SOLN
INTRAMUSCULAR | Status: AC
  Administered 2019-11-01: 25 ug via INTRAVENOUS
  Filled 2019-11-01: qty 2

## 2019-11-01 MED ORDER — SODIUM CHLORIDE 0.9 % IV SOLN
INTRAVENOUS | Status: AC
  Filled 2019-11-01: qty 1.2

## 2019-11-01 MED ORDER — ONDANSETRON HCL 4 MG/2ML IJ SOLN
INTRAMUSCULAR | Status: AC
  Filled 2019-11-01: qty 2

## 2019-11-01 MED ORDER — SUCCINYLCHOLINE CHLORIDE 200 MG/10ML IV SOSY
PREFILLED_SYRINGE | INTRAVENOUS | Status: AC
  Filled 2019-11-01: qty 10

## 2019-11-01 MED ORDER — PROMETHAZINE HCL 25 MG/ML IJ SOLN: 6.2500 mg | INTRAMUSCULAR | Status: DC | PRN

## 2019-11-01 MED ORDER — DEXAMETHASONE SODIUM PHOSPHATE 10 MG/ML IJ SOLN
INTRAMUSCULAR | Status: AC
  Filled 2019-11-01: qty 1

## 2019-11-01 MED ORDER — PROTAMINE SULFATE 10 MG/ML IV SOLN
INTRAVENOUS | Status: DC | PRN
  Administered 2019-11-01: 40 mg via INTRAVENOUS

## 2019-11-01 MED ORDER — ROCURONIUM BROMIDE 10 MG/ML (PF) SYRINGE
PREFILLED_SYRINGE | INTRAVENOUS | Status: AC
  Filled 2019-11-01: qty 10

## 2019-11-01 MED ORDER — SODIUM CHLORIDE 0.9 % IV SOLN
INTRAVENOUS | Status: DC | PRN
  Administered 2019-11-01: 10:00:00 500 mL

## 2019-11-01 MED ORDER — FENTANYL CITRATE (PF) 250 MCG/5ML IJ SOLN
INTRAMUSCULAR | Status: AC
  Filled 2019-11-01: qty 5

## 2019-11-01 SURGICAL SUPPLY — 35 items
ADH SKN CLS APL DERMABOND .7 (GAUZE/BANDAGES/DRESSINGS) ×2
ARMBAND PINK RESTRICT EXTREMIT (MISCELLANEOUS) ×3 IMPLANT
BNDG CMPR STD VLCR NS LF 5.8X4 (GAUZE/BANDAGES/DRESSINGS) ×1
BNDG ELASTIC 4X5.8 VLCR NS LF (GAUZE/BANDAGES/DRESSINGS) ×3 IMPLANT
CANISTER SUCT 3000ML PPV (MISCELLANEOUS) ×3 IMPLANT
CLIP VESOCCLUDE MED 6/CT (CLIP) ×3 IMPLANT
CLIP VESOCCLUDE SM WIDE 6/CT (CLIP) ×3 IMPLANT
COVER PROBE W GEL 5X96 (DRAPES) IMPLANT
COVER WAND RF STERILE (DRAPES) ×1 IMPLANT
DERMABOND ADVANCED (GAUZE/BANDAGES/DRESSINGS) ×4
DERMABOND ADVANCED .7 DNX12 (GAUZE/BANDAGES/DRESSINGS) ×1 IMPLANT
ELECT REM PT RETURN 9FT ADLT (ELECTROSURGICAL) ×3
ELECTRODE REM PT RTRN 9FT ADLT (ELECTROSURGICAL) ×1 IMPLANT
GLOVE BIOGEL PI IND STRL 7.5 (GLOVE) ×1 IMPLANT
GLOVE BIOGEL PI INDICATOR 7.5 (GLOVE) ×2
GLOVE SURG SS PI 7.5 STRL IVOR (GLOVE) ×3 IMPLANT
GOWN STRL REUS W/ TWL LRG LVL3 (GOWN DISPOSABLE) ×2 IMPLANT
GOWN STRL REUS W/ TWL XL LVL3 (GOWN DISPOSABLE) ×1 IMPLANT
GOWN STRL REUS W/TWL LRG LVL3 (GOWN DISPOSABLE) ×6
GOWN STRL REUS W/TWL XL LVL3 (GOWN DISPOSABLE) ×3
HEMOSTAT SNOW SURGICEL 2X4 (HEMOSTASIS) IMPLANT
KIT BASIN OR (CUSTOM PROCEDURE TRAY) ×3 IMPLANT
KIT TURNOVER KIT B (KITS) ×3 IMPLANT
NS IRRIG 1000ML POUR BTL (IV SOLUTION) ×3 IMPLANT
PACK CV ACCESS (CUSTOM PROCEDURE TRAY) ×3 IMPLANT
PAD ARMBOARD 7.5X6 YLW CONV (MISCELLANEOUS) ×6 IMPLANT
SUT PROLENE 5 0 C 1 24 (SUTURE) ×6 IMPLANT
SUT PROLENE 6 0 CC (SUTURE) ×3 IMPLANT
SUT VIC AB 3-0 SH 27 (SUTURE) ×3
SUT VIC AB 3-0 SH 27X BRD (SUTURE) ×1 IMPLANT
SUT VICRYL 4-0 PS2 18IN ABS (SUTURE) ×4 IMPLANT
SYR BULB IRRIG 60ML STRL (SYRINGE) ×2 IMPLANT
TOWEL GREEN STERILE (TOWEL DISPOSABLE) ×3 IMPLANT
UNDERPAD 30X36 HEAVY ABSORB (UNDERPADS AND DIAPERS) ×3 IMPLANT
WATER STERILE IRR 1000ML POUR (IV SOLUTION) ×3 IMPLANT

## 2019-11-01 NOTE — Anesthesia Postprocedure Evaluation (Signed)
Anesthesia Post Note  Patient: Nicole Parrish  Procedure(s) Performed: REVISON OF LEFT FOREARM RADIOCEPHALIC ARTERIOVENOUS FISTULA (Left )     Patient location during evaluation: PACU Anesthesia Type: General Level of consciousness: awake and alert Pain management: pain level controlled Vital Signs Assessment: post-procedure vital signs reviewed and stable Respiratory status: spontaneous breathing, nonlabored ventilation, respiratory function stable and patient connected to nasal cannula oxygen Cardiovascular status: blood pressure returned to baseline and stable Postop Assessment: no apparent nausea or vomiting Anesthetic complications: no   No complications documented.  Last Vitals:  Vitals:   11/01/19 1230 11/01/19 1245  BP: (!) 141/84 130/87  Pulse: 66 71  Resp: 16 14  Temp: 36.6 C   SpO2: 100% 100%    Last Pain:  Vitals:   11/01/19 1230  TempSrc:   PainSc: 4                  Montine Hight S

## 2019-11-01 NOTE — Op Note (Signed)
    Patient name: Nicole Parrish MRN: [id number] DOB: 1970-03-19 Sex: female  11/01/2019 Pre-operative Diagnosis: Aneurysmal left radiocephalic fistula Post-operative diagnosis:  Same Surgeon:  Annamarie Major Assistants: Laurence Slate Procedure:   Revision of left radiocephalic fistula (resection and plication of 15 cm aneurysmal segment) Anesthesia: General Blood Loss: 50 cc Specimens: None  Findings: The patient had a large redundant aneurysm in the forearm.  I resected a large segment and then closed the posterior wall with running 5-0 Prolene.  I then resected additional aneurysmal fistula and close this primarily  Indications: The patient has previously undergone several revisions of her left brachiocephalic fistula.  She now comes with recurrent aneurysmal change and skin thinning over top.  I discussed that I would proceed with plication.  This will involve removing a large portion of the fistula in her forearm.  I will not place a catheter today, however if her fistula cannot be cannulated she understands that she may require a catheter for dialysis until her incisions healed.  Procedure:  The patient was identified in the holding area and taken to [place] 12  The patient was then placed supine on the table. general anesthesia was administered.  The patient was prepped and draped in the usual sterile fashion.  A time out was called and antibiotics were administered.  A PA was necessary to expedite the case and assist with performing the technical details of the procedure.  An elliptical incision was made over top of the fistula from the wrist up just short of the antecubital crease.  This incorporated all irregular skin.  Cautery was used to circumferentially dissect out the fistula which was aneurysmal and redundant.  Once the fistula was fully exposed the patient was heparinized.  The fistula was then occluded with vascular clamps.  A #11 blade was used to open the fistula.  This was very  redundant.  I resected approximately 7 cm of the fistula as well as the aneurysmal segment.  The posterior wall of the 2 resected ends was closed primarily with running 5-0 Prolene.  I then plicated the fistula anteriorly using a running 5-0 Prolene in 2 layers.  Prior to completion the fistula was flushed appropriately and then the repair was closed.  There was an excellent thrill within the fistula.  Hemostasis was then achieved with cautery.  The subcutaneous tissue was closed with a subcutaneous 3 oh layer and a 4-0 Vicryl on the skin.  Dermabond was applied.  A loose Ace wrap was placed over the incision.   Disposition: To PACU stable.   Theotis Burrow, M.D., [facility] [place] Office: [phone] Pager:  [phone]

## 2019-11-01 NOTE — Anesthesia Procedure Notes (Signed)
Procedure Name: LMA Insertion Date/Time: 11/01/2019 10:44 AM Performed by: Leonor Liv, CRNA Pre-anesthesia Checklist: Patient identified, Emergency Drugs available, Suction available and Patient being monitored Patient Re-evaluated:Patient Re-evaluated prior to induction Oxygen Delivery Method: Circle System Utilized Preoxygenation: Pre-oxygenation with 100% oxygen Induction Type: IV induction Ventilation: Mask ventilation without difficulty LMA: LMA inserted LMA Size: 4.0 Number of attempts: 1 Airway Equipment and Method: Bite block Placement Confirmation: positive ETCO2 Tube secured with: Tape Dental Injury: Teeth and Oropharynx as per pre-operative assessment

## 2019-11-01 NOTE — Transfer of Care (Signed)
Immediate Anesthesia Transfer of Care Note  Patient: Nicole Parrish  Procedure(s) Performed: REVISON OF LEFT FOREARM RADIOCEPHALIC ARTERIOVENOUS FISTULA (Left )  Patient Location: PACU  Anesthesia Type:General  Level of Consciousness: drowsy  Airway & Oxygen Therapy: Patient Spontanous Breathing and Patient connected to face mask oxygen  Post-op Assessment: Report given to RN and Post -op Vital signs reviewed and stable  Post vital signs: Reviewed and stable  Last Vitals:  Vitals Value Taken Time  BP 141/84 11/01/19 1230  Temp 36.6 C 11/01/19 1230  Pulse 68 11/01/19 1231  Resp 16 11/01/19 1231  SpO2 100 % 11/01/19 1231  Vitals shown include unvalidated device data.  Last Pain:  Vitals:   11/01/19 0739  TempSrc:   PainSc: 0-No pain      Patients Stated Pain Goal: 3 (92/90/90 3014)  Complications: No complications documented.

## 2019-11-01 NOTE — H&P (Signed)
  POST OPERATIVE DIALYSIS ACCESS OFFICE NOTE    CC:  F/u for dialysis access surgery  HPI:  This is a 49 y.o. 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  She denies bleeding episodes or hand pain or numbness. Denies fever or chills  Her fistula was created years ago and she underwent plication of leftradiocephalic arteriovenous fistulapseudoaneurysms x 2  By Dr. Bridgett Larsson in 2017. She also underwent revision with a reanastomosed in the cephalic vein to the more proximal radial artery 2013.  Dialysis days:  TTS   Dialysis center:  [facility].      Allergies  Allergen Reactions  . No Known Allergies           Current Outpatient Medications  Medication Sig Dispense Refill  . aspirin 81 MG tablet Take 81 mg by mouth daily.      Marland Kitchen b complex-vitamin c-folic acid (NEPHRO-VITE) 0.8 MG TABS Take 1 tablet by mouth every morning.     . cinacalcet (SENSIPAR) 60 MG tablet Take 60 mg by mouth every other day.    . diphenhydrAMINE (BENADRYL) 25 mg capsule Take 25 mg by mouth daily as needed for allergies.    Marland Kitchen ibuprofen (ADVIL,MOTRIN) 200 MG tablet Take 400 mg by mouth every 6 (six) hours as needed for mild pain.    . isosorbide mononitrate (IMDUR) 30 MG 24 hr tablet Take 30 mg by mouth daily.      . Lanthanum Carbonate (FOSRENOL) 1000 MG PACK Take 1,000 mg by mouth 3 (three) times daily with meals.    . lidocaine-prilocaine (EMLA) cream Apply 1 application topically as needed (before dialysis).     . metoprolol succinate (TOPROL-XL) 50 MG 24 hr tablet Take 50 mg by mouth daily. Take with or immediately following a meal.    . oxyCODONE-acetaminophen (ROXICET) 5-325 MG tablet Take 1-2 tablets by mouth every 6 (six) hours as needed for moderate pain. 10 tablet 0  . sertraline (ZOLOFT) 50 MG tablet Take 50 mg by mouth daily.      No current facility-administered medications for this visit.      ROS:  See HPI  BP 90/64   Pulse 79   Temp 98 F (36.7 C) (Skin)   Ht 5' (1.524 m)   Wt 193 lb 3.2 oz (87.6 kg)   SpO2 96%   BMI 37.73 kg/m    Physical Exam:  General appearance:WD, WN female in NAD Cardiac: RRR Respiratory:nonlabored Extremities:  LUE: 2+ radial pulse. Motor and sensation intact. Aneurysmal dilatation x 2 areas of fistula. There appears to be a small area of repeated needle stick distal to these areas without active oozing.      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 Recommend revision. Will arrange on non-dialysis day in near future. Her questions are answered and she agrees with plan.  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 I will not place a catheter today, but she may need one if dialysis can not acces her fistula due to room  [facility]

## 2019-11-02 ENCOUNTER — Encounter (HOSPITAL_COMMUNITY): Payer: Self-pay | Admitting: Surgery

## 2019-12-04 ENCOUNTER — Other Ambulatory Visit: Payer: Self-pay

## 2019-12-04 ENCOUNTER — Ambulatory Visit (INDEPENDENT_AMBULATORY_CARE_PROVIDER_SITE_OTHER): Payer: Self-pay | Admitting: Physician Assistant

## 2019-12-04 VITALS — BP 121/83 | HR 73 | Temp 97.4°F | Resp 20 | Ht 60.0 in | Wt 199.6 lb

## 2019-12-04 DIAGNOSIS — N186 End stage renal disease: Secondary | ICD-10-CM

## 2019-12-04 DIAGNOSIS — Z992 Dependence on renal dialysis: Secondary | ICD-10-CM

## 2019-12-04 NOTE — Progress Notes (Signed)
    Postoperative Access Visit   History of Present Illness   Nicole Parrish is a 49 y.o. year old female who presents for postoperative follow-up for: revision by plication of L radiocephalic fistula by Dr. Trula Slade 11/01/19.  Fistula was created years ago and was revised in 2013, and again in 2017.  She has not had any other dialysis access.  The patient has some thinning skin in the area of incision closest to elbow.  She denies bleeding or drainage.  She continues to dialyze via L radiocephalic fistula and dialysis tech's are avoiding area of incision.  She dialyzes on a TThS schedule at [address] location.   Physical Examination   Vitals:   12/04/19 1003  BP: 121/83  Pulse: 73  Resp: 20  Temp: (!) 97.4 F (36.3 C)  TempSrc: Temporal  SpO2: 100%  Weight: 199 lb 9.6 oz (90.5 kg)  Height: 5' (1.524 m)   Body mass index is 38.98 kg/m.  left arm Incision is healing with some thinning skin at proximal forearm, palpable radial pulse, hand grip is 5/5, sensation in digits is intact, palpable thrill, bruit can be auscultated; incision somewhat macerated    Medical Decision Making   Nicole Parrish is a 49 y.o. year old female who presents s/p revision of L radiocephalic fistula by plication   Patent L radiocephalic without signs or symptoms of steal syndrome  Ok to resume sticking fistula in distal forearm  Continue to avoid area of concern in proximal forearm  Wound care will consist of daily cleansing with soap and water, pat to dry; no dressing, discontinue neosporin  Recheck incision in 2 weeks; return sooner if area in question worsens   Dagoberto Ligas PA-C [facility] [place] Office: [phone]  Clinic MD: Oneida Alar on call

## 2019-12-18 ENCOUNTER — Ambulatory Visit (INDEPENDENT_AMBULATORY_CARE_PROVIDER_SITE_OTHER): Payer: Self-pay | Admitting: Physician Assistant

## 2019-12-18 ENCOUNTER — Other Ambulatory Visit: Payer: Self-pay

## 2019-12-18 VITALS — BP 138/95 | HR 78 | Temp 97.7°F | Resp 20 | Ht 60.0 in | Wt 201.1 lb

## 2019-12-18 DIAGNOSIS — Z992 Dependence on renal dialysis: Secondary | ICD-10-CM

## 2019-12-18 DIAGNOSIS — N186 End stage renal disease: Secondary | ICD-10-CM

## 2019-12-18 NOTE — Progress Notes (Signed)
    Postoperative Access Visit   History of Present Illness   Nicole Parrish is a 49 y.o. year old female who presents for postoperative follow-up for: revision with plication of L radiocephalic fistula by Dr. Trula Slade.  Fistula was created years ago and was revised in 2013 and again in 2017.  She has not had any other dialysis access.  Portion of incision furthest from anastomosis is slow to heal with dry eschar.  She denies bleeding from this area.  Patient was referred back to office due to low flow rates since plication surgery.  She states she has not missed a treatment, and has been able to complete full treatments each time despite low flow rates.  She also states that fistula is only being accessed in small segment near her antecubital fossa.  She dialyzes at the [facility] Location on a TThS schedule under the care of Dr. Jimmy Footman.   Physical Examination   Vitals:   12/18/19 0957  BP: (!) 138/95  Pulse: 78  Resp: 20  Temp: 97.7 F (36.5 C)  TempSrc: Temporal  SpO2: 100%  Weight: 201 lb 1.6 oz (91.2 kg)  Height: 5' (1.524 m)   Body mass index is 39.27 kg/m.  left arm Portion of incision closest to elbow with 3 areas of dry eschar, hand grip is 5/5, sensation in digits is intact, palpable thrill, bruit can be auscultated     Medical Decision Making   Nicole Parrish is a 49 y.o. year old female who presents 6 weeks s/p revision with plication of L radiocephalic fistula now with low flow rate   Patent L radiocephalic fistula without signs or symptoms of steal syndrome  Despite low flow rate, fistula has a palpable thrill throughout the forearm  We discussed accessing fistula with one needle proximal to slow to heal portion of incision and one needle distal to this area.  If there is still a concern with performance of the fistula, patient will be scheduled for fistulogram and will not need to be re-evaluated in our office prior to scheduling.  Return to office if  fistula incision worsens; otherwise patient will follow up as needed   Dagoberto Ligas PA-C [facility] [place] Office: [facility] MD: Trula Slade
